# Patient Record
Sex: Female | Born: 1960 | Race: Black or African American | Hispanic: No | Marital: Married | Smoking: Never smoker
Health system: Southern US, Community
[De-identification: ages and names within clinical notes are randomized; demographics above are authoritative.]

## PROBLEM LIST (undated history)

## (undated) DIAGNOSIS — D126 Benign neoplasm of colon, unspecified: Secondary | ICD-10-CM

## (undated) DIAGNOSIS — Z8719 Personal history of other diseases of the digestive system: Secondary | ICD-10-CM

## (undated) DIAGNOSIS — M179 Osteoarthritis of knee, unspecified: Secondary | ICD-10-CM

## (undated) DIAGNOSIS — K219 Gastro-esophageal reflux disease without esophagitis: Secondary | ICD-10-CM

## (undated) DIAGNOSIS — I499 Cardiac arrhythmia, unspecified: Secondary | ICD-10-CM

## (undated) DIAGNOSIS — K635 Polyp of colon: Secondary | ICD-10-CM

## (undated) DIAGNOSIS — M1710 Unilateral primary osteoarthritis, unspecified knee: Secondary | ICD-10-CM

## (undated) DIAGNOSIS — K222 Esophageal obstruction: Secondary | ICD-10-CM

## (undated) DIAGNOSIS — S82892A Other fracture of left lower leg, initial encounter for closed fracture: Secondary | ICD-10-CM

## (undated) DIAGNOSIS — G43909 Migraine, unspecified, not intractable, without status migrainosus: Secondary | ICD-10-CM

## (undated) DIAGNOSIS — D649 Anemia, unspecified: Secondary | ICD-10-CM

## (undated) DIAGNOSIS — O00109 Unspecified tubal pregnancy without intrauterine pregnancy: Secondary | ICD-10-CM

## (undated) HISTORY — DX: Migraine, unspecified, not intractable, without status migrainosus: G43.909

## (undated) HISTORY — PX: WISDOM TOOTH EXTRACTION: SHX21

## (undated) HISTORY — DX: Anemia, unspecified: D64.9

## (undated) HISTORY — DX: Polyp of colon: K63.5

## (undated) HISTORY — DX: Benign neoplasm of colon, unspecified: D12.6

## (undated) HISTORY — DX: Osteoarthritis of knee, unspecified: M17.9

## (undated) HISTORY — DX: Unspecified tubal pregnancy without intrauterine pregnancy: O00.109

## (undated) HISTORY — PX: COLONOSCOPY: SHX174

## (undated) HISTORY — DX: Gastro-esophageal reflux disease without esophagitis: K21.9

## (undated) HISTORY — DX: Unilateral primary osteoarthritis, unspecified knee: M17.10

## (undated) HISTORY — DX: Esophageal obstruction: K22.2

---

## 1998-02-23 ENCOUNTER — Other Ambulatory Visit: Admission: RE | Admit: 1998-02-23 | Discharge: 1998-02-23 | Payer: Self-pay | Admitting: Obstetrics and Gynecology

## 1999-08-26 ENCOUNTER — Other Ambulatory Visit: Admission: RE | Admit: 1999-08-26 | Discharge: 1999-08-26 | Payer: Self-pay | Admitting: Obstetrics and Gynecology

## 2000-11-27 ENCOUNTER — Other Ambulatory Visit: Admission: RE | Admit: 2000-11-27 | Discharge: 2000-11-27 | Payer: Self-pay | Admitting: Obstetrics and Gynecology

## 2001-03-03 ENCOUNTER — Other Ambulatory Visit: Admission: RE | Admit: 2001-03-03 | Discharge: 2001-03-03 | Payer: Self-pay | Admitting: Obstetrics and Gynecology

## 2002-02-25 ENCOUNTER — Other Ambulatory Visit: Admission: RE | Admit: 2002-02-25 | Discharge: 2002-02-25 | Payer: Self-pay | Admitting: Obstetrics and Gynecology

## 2003-01-14 DIAGNOSIS — O00109 Unspecified tubal pregnancy without intrauterine pregnancy: Secondary | ICD-10-CM

## 2003-01-14 HISTORY — DX: Unspecified tubal pregnancy without intrauterine pregnancy: O00.109

## 2003-01-14 HISTORY — PX: TUBAL LIGATION: SHX77

## 2003-02-16 ENCOUNTER — Ambulatory Visit (HOSPITAL_COMMUNITY): Admission: RE | Admit: 2003-02-16 | Discharge: 2003-02-16 | Payer: Self-pay | Admitting: Obstetrics and Gynecology

## 2003-02-16 ENCOUNTER — Encounter (INDEPENDENT_AMBULATORY_CARE_PROVIDER_SITE_OTHER): Payer: Self-pay | Admitting: *Deleted

## 2003-02-20 ENCOUNTER — Inpatient Hospital Stay (HOSPITAL_COMMUNITY): Admission: RE | Admit: 2003-02-20 | Discharge: 2003-02-20 | Payer: Self-pay | Admitting: Obstetrics and Gynecology

## 2003-05-04 ENCOUNTER — Other Ambulatory Visit: Admission: RE | Admit: 2003-05-04 | Discharge: 2003-05-04 | Payer: Self-pay | Admitting: Obstetrics and Gynecology

## 2004-01-14 HISTORY — PX: ABDOMINAL HYSTERECTOMY: SHX81

## 2004-04-15 ENCOUNTER — Ambulatory Visit: Payer: Self-pay | Admitting: Oncology

## 2004-04-16 ENCOUNTER — Ambulatory Visit: Payer: Self-pay | Admitting: Family Medicine

## 2004-04-17 ENCOUNTER — Inpatient Hospital Stay (HOSPITAL_COMMUNITY): Admission: AD | Admit: 2004-04-17 | Discharge: 2004-04-19 | Payer: Self-pay | Admitting: Internal Medicine

## 2004-04-18 ENCOUNTER — Ambulatory Visit: Payer: Self-pay | Admitting: Gastroenterology

## 2004-04-19 ENCOUNTER — Ambulatory Visit: Payer: Self-pay | Admitting: Internal Medicine

## 2004-04-22 ENCOUNTER — Ambulatory Visit: Payer: Self-pay | Admitting: Gastroenterology

## 2004-04-23 ENCOUNTER — Encounter: Admission: RE | Admit: 2004-04-23 | Discharge: 2004-04-23 | Payer: Self-pay | Admitting: Family Medicine

## 2004-04-23 ENCOUNTER — Ambulatory Visit: Payer: Self-pay | Admitting: Family Medicine

## 2004-04-24 ENCOUNTER — Ambulatory Visit: Payer: Self-pay | Admitting: Gastroenterology

## 2004-04-24 ENCOUNTER — Ambulatory Visit: Payer: Self-pay | Admitting: Oncology

## 2004-05-17 ENCOUNTER — Other Ambulatory Visit: Admission: RE | Admit: 2004-05-17 | Discharge: 2004-05-17 | Payer: Self-pay | Admitting: Obstetrics and Gynecology

## 2004-05-24 ENCOUNTER — Ambulatory Visit: Payer: Self-pay | Admitting: Gastroenterology

## 2004-05-28 ENCOUNTER — Encounter (INDEPENDENT_AMBULATORY_CARE_PROVIDER_SITE_OTHER): Payer: Self-pay | Admitting: *Deleted

## 2004-05-28 ENCOUNTER — Observation Stay (HOSPITAL_COMMUNITY): Admission: RE | Admit: 2004-05-28 | Discharge: 2004-05-29 | Payer: Self-pay | Admitting: Obstetrics and Gynecology

## 2004-06-28 ENCOUNTER — Ambulatory Visit: Payer: Self-pay | Admitting: Oncology

## 2004-11-18 ENCOUNTER — Ambulatory Visit: Payer: Self-pay | Admitting: Oncology

## 2009-07-02 ENCOUNTER — Telehealth: Payer: Self-pay | Admitting: Gastroenterology

## 2009-07-02 ENCOUNTER — Encounter: Payer: Self-pay | Admitting: Gastroenterology

## 2009-08-13 ENCOUNTER — Encounter (INDEPENDENT_AMBULATORY_CARE_PROVIDER_SITE_OTHER): Payer: Self-pay | Admitting: *Deleted

## 2009-08-13 HISTORY — PX: ESOPHAGOGASTRODUODENOSCOPY ENDOSCOPY: SHX5814

## 2009-08-13 HISTORY — PX: COLONOSCOPY: SHX174

## 2009-08-15 ENCOUNTER — Ambulatory Visit: Payer: Self-pay | Admitting: Gastroenterology

## 2009-08-23 ENCOUNTER — Ambulatory Visit: Payer: Self-pay | Admitting: Gastroenterology

## 2009-08-24 ENCOUNTER — Telehealth: Payer: Self-pay | Admitting: Gastroenterology

## 2009-08-24 ENCOUNTER — Encounter: Payer: Self-pay | Admitting: Gastroenterology

## 2009-09-18 ENCOUNTER — Encounter (INDEPENDENT_AMBULATORY_CARE_PROVIDER_SITE_OTHER): Payer: Self-pay | Admitting: *Deleted

## 2010-02-12 NOTE — Progress Notes (Signed)
Summary: rec COL date?  Phone Note From Other Clinic Call back at [facility] Phone [phone]   Caller: Clydie Braun, scheduler Call For: Dr. Russella Dar Reason for Call: Schedule Patient Appt Summary of Call: please call pt directly to discuss possibly moving up her recall COL date... pt concerned about recent hx of COL cancer in family  Initial call taken by: Vallarie Mare,  July 02, 2009 9:23 AM  Follow-up for Phone Call        The above number is a disconnected #.  I have left a message for Clydie Braun at [facility] to call back with a correct phone # Follow-up by: Darcey Nora RN, CGRN,  July 02, 2009 9:52 AM  Additional Follow-up for Phone Call Additional follow up Details #1::        Patient had [facility] with [facility].  She was questioned about a colon.  She wanted Dr Russella Dar to be aware she has a strong hx of CA in her family Father died with colon CA - dx in his 62's Brother recently died of lung/brain CA Sister- CA survivor- she is not sure of the exact kind, but was in the neck. Her recall is in for 04/2012.  She has no symptoms of change in bowel habits or rectal bleeding.  Please advise if this will alter her colon recall date.  New Problems: NEOPLASM, MALIGNANT, COLON, FAMILY HX, FATHER (ICD-V16.0)   Additional Follow-up for Phone Call Additional follow up Details #2::    Given her family history of colon cancer a 5 year interval is recommended and her last colonoscopy was in 04/2004. She is due now. Please schedule direct colonoscopy.  Follow-up by: Meryl Dare MD Clementeen Graham,  July 02, 2009 11:23 AM  Additional Follow-up for Phone Call Additional follow up Details #3:: Details for Additional Follow-up Action Taken: Patient  scheduled for colon [facility] 08/23/09 11:30, pre-visit 08/15/09 10:00 Additional Follow-up by: Darcey Nora RN, CGRN,  July 02, 2009 2:21 PM  New Problems: NEOPLASM, MALIGNANT, COLON, FAMILY HX, FATHER (ICD-V16.0)

## 2010-02-12 NOTE — Procedures (Signed)
Summary: Colonoscopy  Patient: [name] Note: All result statuses are Final unless otherwise noted.  Tests: (1) Colonoscopy (COL)   COL Colonoscopy           DONE      [facility]     [address]     [place], [place]  [address]           COLONOSCOPY PROCEDURE REPORT           PATIENT:  [name], [name]  MR#:  [id number]     BIRTHDATE:  [birth year], 49 yrs. old  GENDER:  female     ENDOSCOPIST:  Judie Petit T. Russella Dar, MD, [facility]           PROCEDURE DATE:  08/23/2009     PROCEDURE:  Colonoscopy with snare polypectomy     ASA CLASS:  Class I     INDICATIONS:  1) Elevated Risk Screening  2) family history of     colon cancer -father at 28.     MEDICATIONS:   Fentanyl 75 mcg IV, Versed 8 mg IV, Benadryl 25 mg     IV     DESCRIPTION OF PROCEDURE:   After the risks benefits and     alternatives of the procedure were thoroughly explained, informed     consent was obtained.  Digital rectal exam was performed and     revealed no abnormalities.   The LB [id number]-[id number] endoscope     was introduced through the anus and advanced to the cecum, which     was identified by both the appendix and ileocecal valve, limited     by a tortuous and redundant colon.    The quality of the prep was     good, using MoviPrep.  The instrument was then slowly withdrawn as     the colon was fully examined.     <<PROCEDUREIMAGES>>     FINDINGS:  A sessile polyp was found in the sigmoid colon. It was     5 mm in size. Polyp was snared without cautery. Retrieval was     successful. A normal appearing cecum, ileocecal valve, and     appendiceal orifice were identified. The ascending, hepatic     flexure, transverse, splenic flexure, descending colon, and rectum     appeared unremarkable.Retroflexed views in the rectum revealed no     abnormalities.  The time to cecum =  5.5  minutes. The scope was     then withdrawn (time =  11.25  min) from the patient and the     procedure completed.  COMPLICATIONS:  None           ENDOSCOPIC IMPRESSION:     1) 5 mm sessile polyp in the sigmoid colon           RECOMMENDATIONS:     1) Await pathology results     2) Repeat Colonoscopy in 5 years pending pathology review           Malcolm T. Russella Dar, MD, Clementeen Graham           CC: Guerry Bruin, MD           n.     Rosalie DoctorVenita Lick. Stark at 08/23/2009 11:58 AM           Myrtie Soman, [id number]  Note: An exclamation mark (!) indicates a result that was not dispersed into the flowsheet. Document Creation Date: 08/23/2009 11:58 AM _______________________________________________________________________  (1) Order result  status: Final Collection or observation date-time: 08/23/2009 11:54 Requested date-time:  Receipt date-time:  Reported date-time:  Referring Physician:   Ordering Physician: Claudette Head [phone]) Specimen Source:  Source: Launa Grill Order Number: [id number] Lab site:   Appended Document: Colonoscopy     Procedures Next Due Date:    Colonoscopy: 08/2014

## 2010-02-12 NOTE — Letter (Signed)
Summary: Office Visit Letter  [facility]  [address] [place], [place] [address]   Phone: [phone]  Fax: [phone]      September 18, 2009 MRN: [id number]   [facility] [address] [place], [place]  [address]   Dear Ms. Waheed,   According to our records, it is time for you to schedule a follow-up office visit with [place] in November.   At your convenience, please call [phone] (option #2)to schedule an office visit. If you have any questions, concerns, or feel that this letter is in error, we would appreciate your call.   Sincerely,  Judie Petit T. Russella Dar, M.D.  [facility] [phone]

## 2010-02-12 NOTE — Letter (Signed)
Summary: [facility] Instructions  [facility]  [address] [place], [place] [address]   Phone: [phone]  Fax: [phone]       [name]    05-13-1960    MRN: [id number]        Procedure Day /Date:  Thursday 08/23/2009     Arrival Time: 10:30 am      Procedure Time: 11:30 am     Location of Procedure:                    _x _  [facility] (4th Floor)                        PREPARATION FOR COLONOSCOPY WITH MOVIPREP   Starting 5 days prior to your procedure Saturday 8/6  do not eat nuts, seeds, popcorn, corn, beans, peas,  salads, or any raw vegetables.  Do not take any fiber supplements (e.g. Metamucil, Citrucel, and Benefiber).  THE DAY BEFORE YOUR PROCEDURE         DATE: Wednesday 8/10  1.  Drink clear liquids the entire day-NO SOLID FOOD  2.  Do not drink anything colored red or purple.  Avoid juices with pulp.  No orange juice.  3.  Drink at least 64 oz. (8 glasses) of fluid/clear liquids during the day to prevent dehydration and help the prep work efficiently.  CLEAR LIQUIDS INCLUDE: Water Jello Ice Popsicles Tea (sugar ok, no milk/cream) Powdered fruit flavored drinks Coffee (sugar ok, no milk/cream) Gatorade Juice: apple, white grape, white cranberry  Lemonade Clear bullion, consomm, broth Carbonated beverages (any kind) Strained chicken noodle soup Hard Candy                             4.  In the morning, mix first dose of MoviPrep solution:    Empty 1 Pouch A and 1 Pouch B into the disposable container    Add lukewarm drinking water to the top line of the container. Mix to dissolve    Refrigerate (mixed solution should be used within 24 hrs)  5.  Begin drinking the prep at 5:00 p.m. The MoviPrep container is divided by 4 marks.   Every 15 minutes drink the solution down to the next mark (approximately 8 oz) until the full liter is complete.   6.  Follow completed prep with 16 oz of clear liquid of your choice  (Nothing red or purple).  Continue to drink clear liquids until bedtime.  7.  Before going to bed, mix second dose of MoviPrep solution:    Empty 1 Pouch A and 1 Pouch B into the disposable container    Add lukewarm drinking water to the top line of the container. Mix to dissolve    Refrigerate  THE DAY OF YOUR PROCEDURE      DATE: Thursday 8/11  Beginning at 6:30 am (5 hours before procedure):         1. Every 15 minutes, drink the solution down to the next mark (approx 8 oz) until the full liter is complete.  2. Follow completed prep with 16 oz. of clear liquid of your choice.    3. You may drink clear liquids until 9:30 am (2 HOURS BEFORE PROCEDURE).   MEDICATION INSTRUCTIONS  Unless otherwise instructed, you should take regular prescription medications with a small sip of water   as early as possible the morning  of your procedure.           OTHER INSTRUCTIONS  You will need a responsible adult at least 49 years of age to accompany you and drive you home.   This person must remain in the waiting room during your procedure.  Wear loose fitting clothing that is easily removed.  Leave jewelry and other valuables at home.  However, you may wish to bring a book to read or  an iPod/MP3 player to listen to music as you wait for your procedure to start.  Remove all body piercing jewelry and leave at home.  Total time from sign-in until discharge is approximately 2-3 hours.  You should go home directly after your procedure and rest.  You can resume normal activities the  day after your procedure.  The day of your procedure you should not:   Drive   Make legal decisions   Operate machinery   Drink alcohol   Return to work  You will receive specific instructions about eating, activities and medications before you leave.    The above instructions have been reviewed and explained to me by   Ezra Sites RN  August 15, 2009 10:29 AM     I fully understand and  can verbalize these instructions _____________________________ Date _________

## 2010-02-12 NOTE — Progress Notes (Signed)
Summary: phone call  ---- Converted from flag ---- ---- 08/24/2009 7:59 AM, Karl Bales RN wrote: Phone Note  Outgoing Call   Call placed by: Karl Bales RN,  August 24, 2009 7:53 AM Call placed to: Patient Summary of Call: Writer called pt this a.m. to check her condition after her procedure yesterday.  Pt states that she had just called the on call MD because she was having "lower back pain" rating as a "4 1/2 to 5."  States she has not had this pain before.  Also, c/o abdominal cramping, rating as a "6".  Pt states she is worried she has not had a BM yet and writing  stated it may take a day or so to have a normal BM.  Pt states she is passing air, has no fever, and has not noted any blood.  Pt was able to eat yesterday and states she ate the foods suggested by recovery room nurse.  Pt states that when she spoke to on call MD, she told them she would prefer to speak with Dr. Russella Dar about her concerns. Initial call taken by: Karl Bales RN,  August 24, 2009 7:56 AM ------------------------------  Phone Note Call from Patient   Summary of Call: If abd pain or back pain does not resolve over next 24-48 hours she should contact us. Per Dr. Russella Dar  Follow-up for Phone Call        Spoke with pt again, states she is still experiencing some cramping and lower back pain.  Pt advised of Dr. Ardell Isaacs order- to call back in 24 to 48 hrs if pain doesn't get better.  Pt told to drink warm fluids t/o day and ambulate around house as much as possible to allow any trapped air to pass.  Suggested pt to follow diet given in RR- avoiding gas forming foods and anything greasy or fatty.  Pt voiced understanding and will notify office with any further needs Follow-up by: Karl Bales RN,  August 24, 2009 9:27 AM

## 2010-02-12 NOTE — Letter (Signed)
Summary: Patient Notice-Hyperplastic Polyps  [facility]  [address] [place], [place] [address]   Phone: [phone]  Fax: [phone]        August 24, 2009 MRN: [id number]    [facility] [address] [place], [place]  [address]    Dear [name],  I am pleased to inform you that the colon polyp(s) removed during your recent colonoscopy was (were) found to be hyperplastic. These types of polyps are NOT pre-cancerous.  It is my recommendation that you have a repeat colonoscopy examination in 5 years.  Should you develop new or worsening symptoms of abdominal pain, bowel habit changes or bleeding from the rectum or bowels, please schedule an evaluation with either your primary care physician or with me.  Continue treatment plan as outlined the day of your exam.  Please call us if you are having persistent problems or have questions about your condition that have not been fully answered at this time.  Sincerely,  Meryl Dare MD [facility]  This letter has been electronically signed by your physician.  Appended Document: Patient Notice-Hyperplastic Polyps letter mailed

## 2010-02-12 NOTE — Progress Notes (Signed)
Summary: phone  Phone Note Outgoing Call   Call placed by: Karl Bales RN,  August 24, 2009 7:53 AM Call placed to: Patient Summary of Call: Writer called pt this a.m. to check her condition after her procedure yesterday.  Pt states that she had just called the on call MD because she was having "lower back pain" rating as a "4 1/2 to 5."  States she has not had this pain before.  Also, c/o abdominal cramping, rating as a "6".  Pt states she is worried she has not had a BM yet and writing  stated it may take a day or so to have a normal BM.  Pt states she is passing air, has no fever, and has not noted any blood.  Pt was able to eat yesterday and states she ate the foods suggested by recovery room nurse.  Pt states that when she spoke to on call MD, she told them she would prefer to speak with Dr. Russella Dar about her concerns. Initial call taken by: Karl Bales RN,  August 24, 2009 7:56 AM

## 2010-02-12 NOTE — Miscellaneous (Signed)
Summary: [facility] PV  Clinical Lists Changes  Medications: Added new medication of MOVIPREP 100 GM  SOLR (PEG-KCL-NACL-NASULF-NA ASC-C) As per prep instructions. - Signed Rx of MOVIPREP 100 GM  SOLR (PEG-KCL-NACL-NASULF-NA ASC-C) As per prep instructions.;  #1 x 0;  Signed;  Entered by: Ezra Sites RN;  Authorized by: Meryl Dare MD [facility];  Method used: Electronically to [employer] [facility]. [place]. [phone]*, [address] [place], [place], [place]  [address], Ph: [phone] or [phone], Fax: [phone] Allergies: Added new allergy or adverse reaction of PENICILLIN Observations: Added new observation of NKA: F (08/15/2009 10:00)    Prescriptions: MOVIPREP 100 GM  SOLR (PEG-KCL-NACL-NASULF-NA ASC-C) As per prep instructions.  #1 x 0   Entered by:   Ezra Sites RN   Authorized by:   Meryl Dare MD [facility]   Signed by:   Ezra Sites RN on 08/15/2009   Method used:   Electronically to        [facility]. [address]. [phone]* (retail)       [address]       [place], [place]  [address]       Ph: [phone] or [phone]       Fax: [phone]   RxID:   [id number]

## 2010-02-12 NOTE — Procedures (Signed)
Summary: Colonoscopy   Colonoscopy  Procedure date:  04/24/2004  Findings:      Location:  [facility].   Patient Name: [name], [name] MRN:  Procedure Procedures: Colonoscopy CPT: [phone].  Personnel: Endoscopist: Venita Lick. Russella Dar, MD, Clementeen Graham.  Referred By: Gershon Crane, MD.  Exam Location: Exam performed in Outpatient Clinic. Outpatient  Patient Consent: Procedure, Alternatives, Risks and Benefits discussed, consent obtained, from patient. Consent was obtained by the RN.  Indications  Evaluation of: Anemia with low iron saturation.  History  Current Medications: Patient is not currently taking Coumadin.  Pre-Exam Physical: Performed Apr 24, 2004. Cardio-pulmonary exam, Rectal exam, HEENT exam , Abdominal exam, Neurological exam, Mental status exam WNL.  Exam Exam: Extent of exam reached: Cecum, extent intended: Cecum.  The cecum was identified by appendiceal orifice and IC valve. Colon retroflexion performed. ASA Classification: II. Tolerance: excellent.  Monitoring: Pulse and BP monitoring, Oximetry used. Supplemental O2 given.  Colon Prep Used Miralax for colon prep. Prep results: excellent.  Sedation Meds: Patient assessed and found to be appropriate for moderate (conscious) sedation. Fentanyl 100 mcg. given IV. Versed 10 mg. given IV.  Comments: Tortuous colon-moderately difficult exam Findings NORMAL EXAM: Cecum to Rectum.   Assessment Normal examination.  Events  Unplanned Interventions: No intervention was required.  Unplanned Events: There were no complications. Plans Disposition: After procedure patient sent to recovery. After recovery patient sent home.  Scheduling/Referral: Colonoscopy, to [facility] T. Russella Dar, MD, [facility], around Apr 24, 2012.  Primary Care Provider, to Gershon Crane, MD, around May 08, 2004.  EGD, to [employer]. Russella Dar, MD, Clementeen Graham, on Apr 24, 2004.    cc: Dr Gershon Crane  This report was created from the original  endoscopy report, which was reviewed and signed by the above listed endoscopist.

## 2010-02-12 NOTE — Procedures (Signed)
Summary: EGD   EGD  Procedure date:  04/24/2004  Findings:      Location: [facility]    EGD  Procedure date:  04/24/2004  Findings:      Location: [facility]   Patient Name: [name], [name] MRN:  Procedure Procedures: Panendoscopy (EGD) CPT: 43235.    with biopsy(s)/brushing(s). CPT: D1846139.  Personnel: Endoscopist: Venita Lick. Russella Dar, MD, Clementeen Graham.  Referred By: Gershon Crane, MD.  Exam Location: Exam performed in Outpatient Clinic. Outpatient  Patient Consent: Procedure, Alternatives, Risks and Benefits discussed, consent obtained, from patient. Consent was obtained by the RN.  Indications  Evaluation of: Anemia,  with low iron saturation.  Symptoms: Reflux symptoms  History  Current Medications: Patient is not currently taking Coumadin.  Pre-Exam Physical: Performed Apr 24, 2004  Cardio-pulmonary exam, HEENT exam, Abdominal exam, Neurological exam, Mental status exam WNL.  Exam Exam Info: Maximum depth of insertion Duodenum, intended Duodenum. Vocal cords not visualized. Gastric retroflexion performed. ASA Classification: II. Tolerance: excellent.  Sedation Meds: Patient assessed and found to be appropriate for moderate (conscious) sedation. Residual sedation present from prior procedure today. Cetacaine Spray 2 sprays given aerosolized. Fentanyl 25 mcg. given IV. Versed 5 mg. given IV.  Monitoring: BP and pulse monitoring done. Oximetry used. Supplemental O2 given  Findings Normal: Proximal Esophagus to Mid Esophagus.  ESOPHAGEAL INFLAMMATION: Severity is moderate, erosions present.  [place] Classification: Grade B. ICD9: Esophagitis, Reflux: 530.11.  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  34 cm from mouth. Lumen diameter is 16 mm. Biopsy of Stricture/Steno  taken. ICD9: Esophageal Stricture: 530.3.  OTHER FINDING: HH-R/O Barrett's in Distal Esophagus. Biopsy/Other Finding taken. Comments: biopsies at 35cm. HH  from 34-38cm.  Normal: Fundus to Duodenal 2nd Portion.   Assessment  Diagnoses: 530.3: Esophageal Stricture.  530.11: Esophagitis, Reflux.   Events  Unplanned Intervention: No unplanned interventions were required.  Unplanned Events: There were no complications. Plans Medication(s): Await pathology. PPI: Pantoprazole/Protonix 40 mg BID,   Patient Education: Patient given standard instructions for: Reflux. Stenosis / Stricture.  Disposition: After procedure patient sent to recovery. After recovery patient sent home.  Scheduling: Office Visit, to [employer]. Russella Dar, MD, [facility], around May 24, 2004.  Primary Care Provider, to Gershon Crane, MD,   cc: Dr Gershon Crane   This report was created from the original endoscopy report, which was reviewed and signed by the above listed endoscopist.

## 2010-02-12 NOTE — Letter (Signed)
Summary: Previsit letter  [facility]  [address] [place], [place] [address]   Phone: [phone]  Fax: [phone]       07/02/2009 MRN: [id number]  [facility] [address] [place], [place]  [address]  Dear [name],  Welcome to the Gastroenterology Division at [facility].    You are scheduled to see a nurse for your pre-procedure visit on 08/15/09 at 10:00 a.m. on the 3rd floor at [facility], [address]  We ask that you try to arrive at our office 15 minutes prior to your appointment time to allow for check-in.  Your nurse visit will consist of discussing your medical and surgical history, your immediate family medical history, and your medications.    Please bring a complete list of all your medications or, if you prefer, bring the medication bottles and we will list them.  We will need to be aware of both prescribed and over the counter drugs.  We will need to know exact dosage information as well.  If you are on blood thinners (Coumadin, Plavix, Aggrenox, Ticlid, etc.) please call our office today/prior to your appointment, as we need to consult with your physician about holding your medication.   Please be prepared to read and sign documents such as consent forms, a financial agreement, and acknowledgement forms.  If necessary, and with your consent, a friend or relative is welcome to sit-in on the nurse visit with you.  Please bring your insurance card so that we may make a copy of it.  If your insurance requires a referral to see a specialist, please bring your referral form from your primary care physician.  No co-pay is required for this nurse visit.     If you cannot keep your appointment, please call [phone] to cancel or reschedule prior to your appointment date.  This allows [place] the opportunity to schedule an appointment for another patient in need of care.    Thank you for choosing [facility] for your  medical needs.  We appreciate the opportunity to care for you.  Please visit [place] at our website  to learn more about our practice.                     Sincerely.                                                                                                                   The Gastroenterology Division  Appended Document: Previsit letter letter mailed to patient's home

## 2010-05-31 NOTE — Discharge Summary (Signed)
[name], LINDAHL              ACCOUNT NO.:  [id number]   MEDICAL RECORD NO.:  [id number]          PATIENT TYPE:  INP   LOCATION:  5511                         FACILITY:  [facility]   PHYSICIAN:  Bruce Rexene Edison. Swords, M.D. Romualdo Bolk OF BIRTH:  Jun 13, 1960   DATE OF ADMISSION:  04/17/2004  DATE OF DISCHARGE:  04/19/2004                                 DISCHARGE SUMMARY   DISCHARGE DIAGNOSES:  1.  Anemia, likely secondary to menometrorrhagia.  2.  Migraine headaches.  3.  History of tubal ligation.   DISCHARGE MEDICATIONS:  None other than [place].   HOSPITAL PROCEDURES:  1.  IV iron.  2.  Portable chest x-ray demonstrates upper limit heart size, hazy opacity      of the medial right upper lobe (followup 2-view chest x-ray is      recommended).   HOSPITAL LABORATORIES:  CBC April 19, 2004:  Hemoglobin 7.9, admission  hemoglobin 5.8.  B12 level 279, iron level 196 (collected April 18, 2004.  TIBC 423, percent saturation 46, ferritin level was low at 8 (normal 10-  291), TSH normal at 0.789, fecal occult blood negative, LDH was normal at  106.   HOSPITAL COURSE:  The patient was admitted to the hospitalist service on  April 17, 2004 with history of shortness of breath and fatigue see Dr. Claris Che  note for details. The patient was seen in consultation by hematology.  The patient was transfused with IV iron and 2 units of packed red blood  cells.  The patient is scheduled for a GI evaluation next week.  She has a  GYN appointment on April 29, 2004.  The patient is stable at the time of  discharge and feels well.      BHS/MEDQ  D:  04/19/2004  T:  04/19/2004  Job:  [id number]   cc:   Guy Sandifer. Arleta Creek, M.D.  [address]  [place]  [place] [address]  Fax: [phone]   Tera Mater. Clent Ridges, M.D. [facility]   Samul Dada, M.D.  [address] [facility]  [place]  [place] [address]  Fax: [phone]

## 2010-05-31 NOTE — Consult Note (Signed)
[name], [name]              ACCOUNT NO.:  [id number]   MEDICAL RECORD NO.:  [id number]          PATIENT TYPE:  INP   LOCATION:  5511                         FACILITY:  [facility]   PHYSICIAN:  Marlowe Kays, P.A.     DATE OF BIRTH:  [birth year]   DATE OF CONSULTATION:  04/18/2004  DATE OF DISCHARGE:                                   CONSULTATION   CONSULTANT:  __________   REASON FOR CONSULTATION:  Severe anemia.   REFERRING MD:  Rene Paci, M.D. [facility].   HISTORY OF PRESENT ILLNESS:  [name] is a delightful 50 year old African-  American female with a history of iron deficiency anemia, on iron supplement  until six months ago, with a baseline hemoglobin of 11 dating back to at  least November 2003.  She was asked to be seen in consultation for  evaluation of severe anemia.  In review, the patient has a history of heavy  menstrual bleeding, which is followed by Dr. Henderson Cloud, having about eight-day  duration of periods, four to five of those days with heavy menses, having to  change pads every 1-1/2 to three hours.  Her last period was on April 08, 2004, lasting about one week.  However, in February, she had had two  periods.  She presented to Dr. Clent Ridges, her primary care physician, with a two-  day history of increasing fatigue, for which labs were drawn.  Labs on April 16, 2004 demonstrated a hemoglobin of 5.8.  Thus, Dr. Clent Ridges recommended that  the patient be admitted in the hospital for further workup.  She was  transfused 2 units of packed red blood cells for a hemoglobin of 5.7.  She  denies any GI bleeding.  No pica.  She is not a vegetarian.  She does drink  a significant amount of iced tea.  She has a history of GERD.  GI evaluated  the patient while in the hospital, recommending outpatient colonoscopy for  further workup.  This is scheduled for April 24, 2004.   PAST MEDICAL HISTORY:  1.  Menorrhagia, followed by Dr. Henderson Cloud.  2.  Migraine headaches.  3.  GERD.  4.   Stress urinary incontinence.  5.  DJD of the knees.   SURGERIES:  Status post tubal ligation.  Status post right salpingectomy  with D&C by Dr. Henderson Cloud in February 2005.   ALLERGIES:  PENICILLIN and AMOXICILLIN.   CURRENT MEDICATIONS:  Protonix 40 mg p.o. daily, Tylenol.   REVIEW OF SYSTEMS:  Remarkable for fatigue, no weight loss or anorexia.  She  has intermittent headaches related to her history of migraines.  No  shortness of breath at rest, but dyspnea on exertion is present.  No cough  or sputum production.  No chest pain or palpitations.  No nausea, vomiting  or diarrhea.  No constipation.  No dysphagia.  She denies any blood in the  stools, dysuria or gross hematuria.  No myalgia, numbness or tingling.  No  peripheral edema.  Of note, the patient states that hysterectomy had been  recommended in the past, when she was  ready.   FAMILY HISTORY:  Mother alive and well.  Father deceased, with a history of  liver cancer and kidney disease.  The rest of the family history is  noncontributory.   SOCIAL HISTORY:  The patient is married.  She has two children in college.  She works at [employer].  No alcohol or tobacco history.   PHYSICAL EXAMINATION:  GENERAL: This is a 50 year old African-American female in no acute distress,  alert and oriented x3.  VITAL SIGNS: Blood pressure 110/68, pulse 90, respirations 16, temperature  98.4.  Pulse oximetry 99% on room air.  Weight 172 pounds.  HEENT: Normocephalic/atraumatic.  PERRLA.  Oral mucosa without thrush.  No  lesions.  NECK: Supple.  No cervical or supraclavicular masses.  LUNGS: Clear to auscultation bilaterally.  No axillary masses.  BREASTS: Without masses.  CARDIOVASCULAR: Regular rate and rhythm, with a very soft, 1/6 systolic  murmur.  No rubs or gallops.  ABDOMEN: Soft, nontender.  Bowel sounds x4.  No palpable spleen or liver.  GENITOURINARY/RECTAL: Deferred.  EXTREMITIES: No clubbing or cyanosis.  No edema.   SKIN: Without lesions, bruising or petechiae.  NEUROLOGICAL: Nonfocal.   LABORATORY DATA:  Hemoglobin 5.7, hematocrit 18.6, white count 6.7,  platelets 524, MCV 53.6, neutrophils 4.2.  PT 15.6, INR 1.4, TSH 0.98, iron  8, ferritin 1.6, B12 220, folic acid 2.5.  Total bilirubin 0.5, alkaline  phosphatase 72, AST 17, ALT 12, total protein 6.5, albumin 3.4, calcium 9.0,  sodium 141, potassium 3.9, BUN 4, creatinine 0.6.   ASSESSMENT AND PLAN:  The patient has been seen and evaluated by Dr.  __________.  Her peripheral smear is virtually diagnostic of iron deficiency  anemia.  She has striking hypochromia.  Nothing to suggest vitamin 12  deficiency by smear despite vitamin 12 equal 220.  Would recommend the  patient receive IV iron tonight, InFeD - iron dextran, and she can be  discharged in the morning with followup.  Will be happy to see the patient  after discharge.  Her IV iron will not interfere with plans for endo and  colonoscopy.  In addition, will check her LDH, epoetin retic counts and  parietal cell and intrinsic factor antibodies.   Thank you much for allowing [place] the opportunity to participate in the care of  [name].      SW/MEDQ  D:  04/19/2004  T:  04/19/2004  Job:  [id number]   cc:   Rene Paci, M.D. [facility]  [address] [place], [place] [address]   Dr. Henderson Cloud

## 2010-05-31 NOTE — Op Note (Signed)
[name], [name]                        ACCOUNT NO.:  [id number]   MEDICAL RECORD NO.:  [id number]                   PATIENT TYPE:  AMB   LOCATION:  SDC                                  [facility]   PHYSICIAN:  Guy Sandifer. Arleta Creek, M.D.           DATE OF BIRTH:  1960-04-30   DATE OF PROCEDURE:  02/16/2003  DATE OF DISCHARGE:                                 OPERATIVE REPORT   PREOPERATIVE DIAGNOSIS:  Probable ectopic pregnancy.   POSTOPERATIVE DIAGNOSIS:  Probable ectopic pregnancy.   PROCEDURES:  1. Laparoscopy with right salpingectomy.  2. Dilation and curettage.   SURGEON:  Guy Sandifer. Henderson Cloud, M.D.   ANESTHESIA:  General with endotracheal intubation, Laqueta Due, M.D.   ESTIMATED BLOOD LOSS:  Less than 100 mL.   SPECIMENS:  1. Endometrial curettings.  2. Right fallopian tube.   INDICATIONS AND CONSENT:  This patient is a 50 year old, married, black  female, G2, P2, status post tubal ligation approximately 13 years ago, whose  last menstrual period was around December 20.  She has not had any bleeding,  cramping, fatigue, nausea, breast tenderness, or syncope.  She had a  positive urine pregnancy test at home which was verified in my office.  Ultrasound on February 2 revealed a large amount of fluid in the endometrial  canal with an extremely irregularly shaped endometrial canal.  There was  nothing suggesting a pole, nothing consistent with a gestational sac.  A 1.3  cm round hypoechoic mass, highly suspicious for ectopic pregnancy was noted  in the right adnexa.  There was no free fluid.  Possibility of ectopic  pregnancy was discussed.  The fact that the fluid contained in the  endometrial canal did not appear to be a gestational sac and certainly not a  normal gestational sac at the very least.  It is discussed with the patient  and her husband preoperatively.  Options of management have been reviewed,  and she presents for surgery.  D&C with laparoscopy,  probable right  salpingectomy has been discussed.  Potential risks and complications  including but not limited to infection; bowel, bladder, ureteral damage;  bleeding requiring transfusion of blood products with possible transfusion  reaction; HIV and hepatitis; DVT, PE, pneumonia, laparotomy, hysterectomy,  were all discussed.  All questions have been answered, and consent is signed  on the chart.   FINDINGS:  D&C produces a large amount of tissue, possibly consistent with  products of conception.  Abdominally, upper abdomen is normal.  There is no  free fluid.  She is status post tubal ligation, obviously on the left.  On  the right, the fallopian tube is largely intact except for a definite defect  in the very proximal portion of the tube.  There is a 1.5-2 cm mass in the  proximal one-third of the right fallopian tube.  It bleeds easily when  touched.  It appears possibly consistent with  an ectopic pregnancy.   DESCRIPTION OF PROCEDURE:  The patient was taken to the operating room where  she was identified.  She was then placed in a dorsal supine position where  general anesthesia was induced via endotracheal intubation.  She was then  placed in the dorsal lithotomy position where she was prepped abdominally  and vaginally.  Bladder was straight-catheterized, and she was draped in a  sterile fashion.  Cervix was then injected anteriorly and grasped with a  single-tooth tenaculum with 1% Xylocaine plain.  Paracervical block is then  placed at the 2, 4, 5, 7, 8, and 10 o'clock positions with approximately 20  mL total of 1% Xylocaine.  Cervix was then gently progressively dilated to a  25 Pratt dilator.  Some clear fluid was noted to egress.  Sharp curettage is  then carried out for a fair amount of tissue.  Therefore, suction curettage  with a #8 curved curette is carried out.  This is done with alternating  sharp and suction curettage until the cavity is clean.  Pitocin 20 units  are  added to the IV bag of fluids containing 1 liter.  At the completion of  this, good hemostasis is noted, and the single-tooth tenaculum is replaced  with a Hulka tenaculum.  Next, attention is turned to the abdomen.  A small  infraumbilical incision is made, and the Veress needle is placed without  difficulty.  Syringe and drop test are normal.  Two liters of gas are  insufflated under low pressure with good tympany in the right upper  quadrant.  Veress needle is then removed and is replaced with a 10-11  disposable trocar sleeve.  Proper placement is verified with a laparoscope,  and no damage to the surrounding structures is noted.  A small suprapubic  incision is made in the midline, and a 5 mm nondisposable trocar sleeve was  placed under direct visualization without difficulty.  The above described  findings are noted.  The mesosalpinx of the right fallopian tube is taken  down with a harmonic scalpel to the point it completely encircles the above-  described mass, thereby removing the entire section of fallopian tube.  Good  hemostasis is obtained also with bipolar cautery.  The section of fallopian  tube is removed via the trocar sleeve.  Hemostasis is verified under reduced  pneumoperitoneum.  Irrigation is carried out.  Suprapubic trocar sleeve is  removed.  Pneumoperitoneum is reduced, and the umbilical trocar sleeve is  removed.  The umbilical incision is closed with a 0 Vicryl suture in the  subcutaneous layers with great care being taken not to pick up any  underlying structures.  The incisions are injected with 0.5% plain Marcaine,  and the skin is closed with Dermabond on both incisions.  Hulka tenaculum is  removed.  No bleeding is noted.  The patient is awakened and taken to the  recovery room in stable condition.  All counts are correct.                                               Guy Sandifer Arleta Creek, M.D.    JET/MEDQ  D:  02/16/2003  T:  02/16/2003  Job:   [id number]

## 2010-05-31 NOTE — Op Note (Signed)
NAMEKARILYNN, CARRANZA              ACCOUNT NO.:  [id number]   MEDICAL RECORD NO.:  [id number]          PATIENT TYPE:  OBV   LOCATION:  9399                          [facility]  [facility]   PHYSICIAN:  Guy Sandifer. Henderson Cloud, M.D. DATE OF BIRTH:  04-Sep-1960   DATE OF PROCEDURE:  05/28/2004  DATE OF DISCHARGE:                                 OPERATIVE REPORT   PREOPERATIVE DIAGNOSES:  1.  Uterine leiomyomata.  2.  Right ovarian cyst.   POSTOPERATIVE DIAGNOSES:  1.  Uterine leiomyomata.  2.  Right ovarian cyst.   PROCEDURE:  Laparoscopically-assisted vaginal hysterectomy with right  oophorectomy.   SURGEON:  Guy Sandifer. Henderson Cloud, M.D.   ASSISTANTFreddy Finner, M.D.   ANESTHESIA:  General with endotracheal intubation.   ESTIMATED BLOOD LOSS:  Less than 100 mL.   SPECIMENS:  Uterus and right ovary.   INDICATIONS AND CONSENT:  This patient is a 50 year old married, black  female, G2, P2, status post tubal ligation with known uterine leiomyomata,  right ovarian cyst.  Details are dictated in History and Physical.  Laparoscopically assisted vaginal hysterectomy, possible right oophorectomy  has been discussed preoperatively.  The patient is status post right  salpingectomy.  The potential risks and complications have been reviewed and  include but are not limited to infection, bowel, bladder, ureteral damage,  bleeding requiring transfusion of blood products, and possible transfusion  reaction, HIV and hepatitis acquisition, DVT, PE, pneumonia, fistula  formation, postoperative dyspareunia.  All questions have been answered and  consent is signed on the chart.   FINDINGS:  Upper abdomen is grossly normal.  The uterus has at least one 4-5  cm intramural fibroid on the anterior uterine fundus.  The right tube has  been surgical removed.  The right ovary contains a 2-3 cm cyst on its medial  pole.  The left ovary is normal.  The left tube is status post ligation.   PROCEDURE IN DETAIL:  The  patient was taken to the operating room where she  was identified, placed in the dorsal supine position, and general anesthesia  was induced via endotracheal intubation.  She is then placed in the dorsal  lithotomy position where she was prepped abdominally and vaginally, bladder  straight catheterized.  Hulka tenaculum was placed in the uterus as a  manipulator, and she is draped in a sterile fashion.  A small infraumbilical  incision is made and a disposable Veress needle was placed.  Normal syringe  and drop test are noted, 2 L of gas were then insufflated under low pressure  with good tympany in the right upper quadrant.  A Veress needle is remove  and is replaced with a 10-11 bladeless, disposable, trocar.  Inspection  reveals no damage to surrounding structures.  Pneumoperitoneum is induced.  A small suprapubic incision is made and a 5 mm disposable bladeless trocar  sleeve is placed under direct visualization without difficulty.  The above  findings were noted.  The course of the right ureter is noted.  Then using  the Gyrus bipolar cautery cutting instrument, the right ovary is taken  down  and it is carried across the round ligament along the margin of the uterus  down to the level of the vesicouterine peritoneum.  The left proximal  ligaments were taken down in a similar fashion to the vesicouterine  peritoneum.  Good hemostasis is maintained bilaterally.  The vesicouterine  peritoneum is then incised in the midline, hydrodissected, and taken down  cephalolaterally with scissors.  Good hemostasis is noted.  Excess fluid is  removed.  The suprapubic trocar sleeve is removed and attention is turned to  the vagina.   The posterior cul-de-sac is entered sharply and the cervix is circumscribed.  Mucosa was then advanced sharply and bluntly.  Then using the Gyrus cautery,  the uterosacral ligaments were taken down bilaterally.  This was followed by  the bladder pillars and the cardinal  ligaments and uterine vessels  bilaterally.  Anterior cul-de-sac  was entered without difficulty.  After  the uterine vessels had been controlled, the fundus was delivered  posteriorly and the proximal ligaments were taken down without difficulty.  The uterosacral ligaments were then plicated to the vaginal cuff bilaterally  with separate sutures of 0 Monocryl.  All suture will be 0 Monocryl unless  otherwise designated.  A suture was placed in the 3 and 9 o'clock positions  to help assure hemostasis on vaginal cuff as well.  The uterosacral  ligaments were then plicated in the midline with a separate suture.  The  cuff was closed with figure-of-eight sutures.  A Foley catheter was placed  in the bladder and clear urine is noted.   Attention was then returned to the abdomen.  Careful inspection and  irrigation reveals minor oozing at some peritoneal edges which is easily  controlled with bipolar cautery.  Re-inspection and reducing the  pneumoperitoneum reveals good hemostasis all around.  Suprapubic trocar  sleeves is removed.  Pneumoperitoneum is completely reduced and the  umbilical trocar sleeve is removed.  A 0 Vicryl was used to reapproximate  the fascia in the umbilical incision with care being taken not to pick up  any underlying structures.  Both incisions were then injected with 0.5%  plain Marcaine and the skin is closed with Dermabond on both incisions.  All  counts were correct.  The patient was awakened, taken to recovery room in  stable condition.      JET/MEDQ  D:  05/28/2004  T:  05/28/2004  Job:  [id number]

## 2010-05-31 NOTE — Discharge Summary (Signed)
NAMEODILE, VELOSO              ACCOUNT NO.:  [id number]   MEDICAL RECORD NO.:  [id number]          PATIENT TYPE:  OBV   LOCATION:  9315                          [facility]   PHYSICIAN:  Guy Sandifer. Henderson Cloud, M.D. DATE OF BIRTH:  08-08-1960   DATE OF ADMISSION:  05/28/2004  DATE OF DISCHARGE:  05/29/2004                                 DISCHARGE SUMMARY   ADMITTING DIAGNOSES:  1.  Uterine leiomyomata.  2.  Right ovarian cyst.   DISCHARGE DIAGNOSES:  1.  Uterine leiomyomata.  2.  Right ovarian cyst.   PROCEDURE:  On May 27, 2004, laparoscopically assisted vaginal hysterectomy  and right oophorectomy.   REASON FOR ADMISSION:  The patient is a 50 year old married black female,  G2, P2, status post tubal ligation with known leiomyomata and increasing  symptoms.  Details in dictated History and Physical.  She is admitted for  surgical management.   HOSPITAL COURSE:  The patient is admitted to the hospital and undergoes the  above procedure.  Estimated blood loss is less than 100 cc.  On the evening  of surgery, she is having some nausea and headache with the medication.  There is a question as to whether it is related to the morphine or to the  Toradol.  The morphine is changed to Dilaudid and the Toradol was  discontinued.  Vital signs are stable and she is afebrile with clear urine  output.   On the day of discharge, she is tolerating a regular diet and feeling much  better with good pain relief with no nausea, vomiting, or headache.  She is  ambulating well.  Vital signs are stable and she is afebrile.  Abdomen is  soft and nontender.  Hemoglobin is 10.7, white count 9.0, and pathology is  pending.   CONDITION ON DISCHARGE:  Good.   DIET:  Regular as tolerated.   ACTIVITY:  No lifting, no operation of automobile, no vaginal entry.   DISCHARGE INSTRUCTIONS:  She is to call the office for problems including,  but not limited to, temperature of 101 degrees, persistent  nausea/vomiting,  increasing pain, or heavy vaginal bleeding.   FOLLOW UP:  In the office in 2 weeks.   MEDICATIONS:  1.  Percocet 5/325 mg #30 1-2 p.o. q.6h. p.r.n.  2.  Multivitamin daily.      JET/MEDQ  D:  05/29/2004  T:  05/29/2004  Job:  [id number]

## 2010-05-31 NOTE — H&P (Signed)
[name], [name]                        ACCOUNT NO.:  [id number]   MEDICAL RECORD NO.:  [id number]                   PATIENT TYPE:  AMB   LOCATION:  SDC                                  [facility]   PHYSICIAN:  Guy Sandifer. Arleta Creek, M.D.           DATE OF BIRTH:  Jun 02, 1960   DATE OF ADMISSION:  02/16/2003  DATE OF DISCHARGE:                                HISTORY & PHYSICAL   CHIEF COMPLAINT:  Probable ectopic pregnancy.   HISTORY OF PRESENT ILLNESS:  The patient is a 50 year old married black  female, G2, P2, status post tubal ligation in approximately 1995 with her  last menstrual period January 02, 2003.  She reports a positive urine  pregnancy test at home.  She also had a positive urine pregnancy test in my  office.  Denies pelvic pain, vaginal bleeding, syncope, nausea or vomiting.  Ultrasound in my office on February 15, 2003, reveals a large amount of fluid  in a very irregularly shaped endometrial canal not consistent with a  gestational sac.  The right ovary contains a 2.3 cm cyst.  There is a 1.3 cm  round hypoechoic mass highly suspicious for ectopic in the right adnexa  adjacent to the right ovary.  There is no free fluid noted.  The probability  of ectopic pregnancy is discussed with the patient.  Options and management  are discussed, and the patient wants surgical management.  She is being  admitted for laparoscopy, probable salpingectomy, and D&C.  Potential risks  and complications have been discussed with the patient preoperatively.   PAST MEDICAL HISTORY:  1. History of anemia.  2. History of menometrorrhagia.  3. History of headaches.  4. Gastroesophageal reflux disease.  5. Stress urinary incontinence.   PAST SURGICAL HISTORY:  Tubal ligation.   PAST OBSTETRICAL HISTORY:  Vaginal delivery x 2.   FAMILY HISTORY:  Diabetes in paternal grandmother, liver cancer in father,  prostate cancer in paternal grandfather, chronic hypertension maternal  grandmother, asthma in father, kidney disease in father.   SOCIAL HISTORY:  The patient denies tobacco, alcohol, or drug abuse.   MEDICATIONS:  Protonix daily, iron supplement daily.   ALLERGIES:  PENICILLIN and AMOXICILLIN.   REVIEW OF SYSTEMS:  NEUROLOGIC:  Denies recent headache.  CARDIAC:  Denies  chest pain. PULMONARY:  Denies shortness of breath.  GI:  Denies recent  changes in bowel habits.  Has reflux as above.   PHYSICAL EXAMINATION:  VITAL SIGNS:  Height 6 feet 0 inches, weight 190  pounds.  Blood pressure 108/70.  NECK:  Without thyromegaly.  LUNGS:  Clear to auscultation.  HEART:  Regular rate and rhythm.  BACK:  Without CVA tenderness.  BREASTS:  Without discharge.  ABDOMEN:  Soft, nontender.  PELVIC:  Exam deferred.  EXTREMITIES:  Grossly  within normal limits.  NEUROLOGIC:  Grossly  within normal limits.   IMPRESSION:  Probable ectopic pregnancy.   PLAN:  1. Laparoscopy.  2. Probable right salpingectomy.  3. Dilatation and curettage.                                               Guy Sandifer Arleta Creek, M.D.    JET/MEDQ  D:  02/15/2003  T:  02/15/2003  Job:  [id number]

## 2010-05-31 NOTE — H&P (Signed)
[name], [name]              ACCOUNT NO.:  [id number]   MEDICAL RECORD NO.:  [id number]          PATIENT TYPE:  INP   LOCATION:  5511                         FACILITY:  [facility]   PHYSICIAN:  Tera Mater. Clent Ridges, M.D. [facility] OF BIRTH:  17-Oct-1960   DATE OF ADMISSION:  04/17/2004  DATE OF DISCHARGE:                                HISTORY & PHYSICAL   CHIEF COMPLAINT:  This is a 50 year old woman with profound anemia, also  weakness and shortness of breath.   HISTORY OF PRESENT ILLNESS:  The patient has a history of mild anemia which  has been controlled with intermittent iron supplements.  The last recorded  hemoglobin I see on her chart was 11.0 on November 30, 2001.  She has been  on iron supplements for years, although she admits to stopping taking her  iron supplement about six months ago.  She does have a history of some GE  reflux disease, but no other known GI problems.  She does have a history of  heavy menstrual bleeding and, in fact, has been recommended by her  gynecologist (Dr. Henderson Cloud) to have a hysterectomy at some point.  She denies  being a vegetarian and says she has a fairly wide ranging diet.  For the  past several weeks she has had progressive fatigue and shortness of breath  on exertion.  She denies chest pain or abdominal pain.  She was seen  yesterday in the clinic with a fairly unremarkable examination, then today  her laboratories came back (see full detailed report) with a hemoglobin of  5.8.   PAST MEDICAL HISTORY:  1.  Migraine headaches.  2.  Status post a tubal ligation.   ALLERGIES:  None.   CURRENT MEDICATIONS:  1.  Maxalt as needed for headaches.  2.  The patient had been on iron sulfate 325 mg a day until she stopped six      months ago.   HABITS:  She does not use tobacco or alcohol.   SOCIAL HISTORY:  She is married.  She is a Merchandiser, retail with [employer].   FAMILY HISTORY:  Her father has asthma and liver cancer.  Her mother is  healthy.  One brother has emphysema.  One sister had a malignant tumor  removed from her neck and there is a strong family history of diabetes.   OBJECTIVE:  GENERAL:  She is in no apparent distress.  VITAL SIGNS:  Weight 172, temperature 97.3 degrees, pulse 68 and regular,  respirations 10 and comfortable, blood pressure 120/82.  SKIN:  Free of remarkable lesions.  HEENT:  Eyes are clear.  Ears are clear.  Oropharynx is clear.  NECK:  Supple without lymphadenopathy or masses.  LUNGS:  Clear.  CARDIAC:  Rate and rhythm are regular without gallops, murmurs, or rubs.  Distal pulses are full.  ABDOMEN:  Soft.  Normal bowel sounds.  Nontender.  No masses.  EXTREMITIES:  No clubbing, cyanosis, edema.  NEUROLOGIC:  Grossly intact.   LABORATORIES:  Report is remarkable for hemoglobin of 5.8 and MCV of 54.8, a  normal white blood cell count,  a normal platelet count.  Ferritin low at  1.6.  Folic acid low at 2.5.  B12 at the lower end of normal at 220.   IMPRESSION AND PLAN:  Severe multifactorial anemia probably compounded by  heavy menstrual losses.  Patient will be admitted for further work-up.  Will  consult gastroenterology to check for possible gastrointestinal losses.  Will consult hematology to look for blood dyscrasias.  Certainly  consideration for hysterectomy in the near future is warranted.  Once some  appropriate initial studies are done patient will need to be transfused  blood to get back to normal levels.  At the moment she is quite  hemodynamically stable and symptom-free at rest.  We will probably consult  nutrition at some point as well prior to discharge.       ___________________________________________  Tera Mater. Clent Ridges, M.D. [facility]    SAF/MEDQ  D:  04/17/2004  T:  04/17/2004  Job:  [id number]

## 2010-05-31 NOTE — H&P (Signed)
NAMEBRIANDA, [name]              ACCOUNT NO.:  [id number]   MEDICAL RECORD NO.:  [id number]          PATIENT TYPE:  AMB   LOCATION:  SDC                           FACILITY:  [facility]   PHYSICIAN:  Guy Sandifer. Henderson Cloud, M.D. DATE OF BIRTH:  1960/07/19   DATE OF ADMISSION:  05/28/2004  DATE OF DISCHARGE:                                HISTORY & PHYSICAL   CHIEF COMPLAINT:  Uterine fibroids.   HISTORY OF PRESENT ILLNESS:  This patient is a 50 year old, married, black  female, G2, P2, status post tubal ligation with a known uterine leiomyomata.  Over the past year she had increasingly heavy menses.  This led to admission  to [facility] earlier this year with anemia, reportedly with a  hemoglobin of 5.8.  She was given two units of packed red blood cells as  well as an iron transfusion.  Endoscopy and colonoscopy were consistent with  reflux esophagitis but no obvious source of bleeding.  Ultrasound in my  office, on April 29, 2004, revealed a uterus measuring 9.6 x 5.0 x 6.7-cm  with a 3.9-cm subserosal fibroid on the posterior uterine wall.  The right  ovary contained a 2.8-cm cyst with septations and internal echoes, possibly  representing an endometrioma versus a hemorrhagic cyst.  The patient had  been taking iron supplements.  Capillary hemoglobin is 11.2 on May 18, 2004.  After discussing alternatives for treatment, the patient is being admitted  for a laparoscopically assisted vaginal hysterectomy and a possible right  salpingo-oophorectomy.  Potential risks and complications of the procedure  have been discussed with the patient preoperatively.   PAST MEDICAL HISTORY:  1.  Anemia as above.  2.  Reflux disease and esophagitis as above.  3.  History of menometrorrhagia.  4.  History of headaches.   PAST SURGICAL HISTORY:  1.  Tubal ligation.  2.  Laparoscopy with right salpingectomy for ectopic pregnancy, 2005.   PAST OBSTETRIC HISTORY:  Vaginal delivery x 2.   FAMILY  HISTORY:  Diabetes in paternal grandmother.  Liver cancer in father.  Prostate cancer in paternal grandfather.  Chronic hypertension in maternal  grandmother.  Asthma in father.  Kidney disease in father.   SOCIAL HISTORY:  Denies tobacco, alcohol, or drug abuse.   MEDICATIONS:  Protonix daily, multivitamin daily.   ALLERGIES:  1.  PENICILLIN.  2.  AMOXICILLIN.   REVIEW OF SYSTEMS:  NEURO:  Denies headache.  PULMONARY:  Denies shortness  of breath.  CARDIAC:  Denies chest pain.  GI:  Denies recent changes in  bowel habits.   PHYSICAL EXAMINATION:  VITAL SIGNS:  Height 5 feet 11 inches, weight 172  pounds, blood pressure 98/60.  HEENT:  Without thyromegaly.  LUNGS:  Clear to auscultation.  HEART:  Regular rate and rhythm.  BACK:  Without CVA tenderness.  BREASTS:  Without mass, _retraction or_________  discharge.  ABDOMEN:  Soft nontender without masses.  PELVIC:  Uterus is upper limits of normal size, mobile, nontender.  Adnexa  nontender without palpable masses.  EXTREMITIES:  Grossly within normal limits.  NEUROLOGIC:  Grossly within normal  limits.   ASSESSMENT:  Uterine leiomyomata.   PLAN:  Laparoscopically assisted vaginal hysterectomy, possible right  salpingo-oophorectomy.      JET/MEDQ  D:  05/17/2004  T:  05/17/2004  Job:  [id number]

## 2010-05-31 NOTE — Discharge Summary (Signed)
[name], [name]              ACCOUNT NO.:  [id number]   MEDICAL RECORD NO.:  [id number]          PATIENT TYPE:  INP   LOCATION:  5511                         FACILITY:  [facility]   PHYSICIAN:  Marlowe Kays, P.A.     DATE OF BIRTH:  [birth year]   DATE OF ADMISSION:  04/17/2004  DATE OF DISCHARGE:                                 DISCHARGE SUMMARY   ADDENDUM TO DICTATION [phone]   The following labs are available prior to her discharge:  Vitamin B12 279.  Iron after transfusion is 196, TIBC 423, percentage saturation 46, ferritin  8, TAH 0.789.  Reticulocyte count is 12.7, LDH 106.  New CBC shows white  count 7.8, hemoglobin 7.9, hematocrit 24.7, and platelet count 587, with  neutrophil count of 6.9.  The following are pending:  Labs drawn on April 6,  that is, folic acid, erythropoietin, parietal cell A and intrinsic factor B.      SW/MEDQ  D:  04/19/2004  T:  04/19/2004  Job:  [id number]

## 2011-07-11 ENCOUNTER — Telehealth: Payer: Self-pay | Admitting: Gastroenterology

## 2011-07-11 NOTE — Telephone Encounter (Signed)
Last colonoscopy was 08/2009. She had a 5 mm hyperplastic polyp removed from the sigmoid colon.  She reports a strong family history of colon cancer in her father diagnosed in his late 54s, her maternal grandmother had gallbladder CA and a maternal grandfather with lung CA.  She has no current symptoms or complaints.  She would like to schedule a colonoscopy now and not wait until 08/2014.  Dr. Russella Dar please advise

## 2011-07-11 NOTE — Telephone Encounter (Signed)
Patient advised.

## 2011-07-11 NOTE — Telephone Encounter (Signed)
Family history of gallbladder cancer and lung cancer does not increase her risk of colon cancer.  Family history of colon cancer in her father and GM would be a 5 year screening interval. We can move to 3 years or 08/2012 but would not do colonoscopy sooner unless she is having symptoms.

## 2011-07-11 NOTE — Telephone Encounter (Signed)
Left message for patient to call back  

## 2012-03-31 ENCOUNTER — Encounter (HOSPITAL_COMMUNITY): Payer: Self-pay | Admitting: Emergency Medicine

## 2012-03-31 DIAGNOSIS — M545 Low back pain, unspecified: Secondary | ICD-10-CM | POA: Insufficient documentation

## 2012-03-31 DIAGNOSIS — R0789 Other chest pain: Secondary | ICD-10-CM | POA: Insufficient documentation

## 2012-03-31 DIAGNOSIS — R1013 Epigastric pain: Secondary | ICD-10-CM | POA: Insufficient documentation

## 2012-03-31 DIAGNOSIS — M2170 Unequal limb length (acquired), unspecified site: Secondary | ICD-10-CM | POA: Insufficient documentation

## 2012-03-31 DIAGNOSIS — K219 Gastro-esophageal reflux disease without esophagitis: Secondary | ICD-10-CM | POA: Insufficient documentation

## 2012-03-31 DIAGNOSIS — Z90710 Acquired absence of both cervix and uterus: Secondary | ICD-10-CM | POA: Insufficient documentation

## 2012-03-31 LAB — BASIC METABOLIC PANEL
BUN: 11 mg/dL (ref 6–23)
Calcium: 9.3 mg/dL (ref 8.4–10.5)
GFR calc non Af Amer: >90 mL/min (ref >90)
Glucose, Bld: 110 mg/dL — ABNORMAL HIGH (ref 70–99)
Potassium: 3.5 mEq/L (ref 3.5–5.1)

## 2012-03-31 LAB — CBC
HCT: 37.9 % (ref 36.0–46.0)
Hemoglobin: 13.3 g/dL (ref 12.0–15.0)
MCH: 28.7 pg (ref 26.0–34.0)
MCHC: 35.1 g/dL (ref 30.0–36.0)
MCV: 81.7 fL (ref 78.0–100.0)

## 2012-03-31 NOTE — ED Notes (Addendum)
C/o intermittent pressure to center of chest x 9 months.  States she thought she was having acid reflux- has not tried taking any antacids in the last 9 months.  Denies sob, nausea, and vomiting.  Reports she vomited a couple days ago because she was eating shrimp that got stuck in esophagus.  Pain resolved at this time.

## 2012-04-01 ENCOUNTER — Emergency Department (HOSPITAL_COMMUNITY)
Admission: EM | Admit: 2012-04-01 | Discharge: 2012-04-01 | Disposition: A | Discharge status: Home / Self Care | Payer: BC Managed Care – PPO | Attending: Emergency Medicine | Admitting: Emergency Medicine

## 2012-04-01 DIAGNOSIS — M545 Low back pain: Secondary | ICD-10-CM

## 2012-04-01 DIAGNOSIS — R079 Chest pain, unspecified: Secondary | ICD-10-CM

## 2012-04-01 DIAGNOSIS — K219 Gastro-esophageal reflux disease without esophagitis: Secondary | ICD-10-CM

## 2012-04-01 DIAGNOSIS — M2170 Unequal limb length (acquired), unspecified site: Secondary | ICD-10-CM

## 2012-04-01 MED ORDER — PANTOPRAZOLE SODIUM 40 MG PO TBEC
40.0000 mg | DELAYED_RELEASE_TABLET | Freq: Every day | ORAL | Status: DC
  Administered 2012-04-01: 40 mg via ORAL
  Filled 2012-04-01: qty 1

## 2012-04-01 MED ORDER — OMEPRAZOLE 20 MG PO CPDR: 40.0000 mg | DELAYED_RELEASE_CAPSULE | Freq: Every day | ORAL | Status: DC

## 2012-04-01 NOTE — ED Provider Notes (Signed)
History     CSN: [id number]  Arrival date & time 03/31/12  2043   First MD Initiated Contact with Patient 04/01/12 0022      Chief Complaint  Patient presents with  . Chest Pain    (Consider location/radiation/quality/duration/timing/severity/associated sxs/prior treatment) HPI [name] is a 52 y.o. female she presents to the ER with epigastric/chest pain-she was referred from urgent care was concerned with her EKG. Initially she had presented with left flank pain to the urgent care.  She has a history of GERD and  presents with epigastric pressure. She says this pain is been going on for months. She describes as a pressure like someone is sitting on her chest, she says it's been associated with food not going all the way down, (she had to regurgitate food the other day) not associated with shortness of breath, no radiation of pain, no diaphoresis or dizziness. The pain is intermittent, it is brief lasting seconds to minutes and is currently not present.  4-5 years ago, patient had endoscopy showing GERD, was on Protonix but stopped taking it because she thought she did not have GERD symptoms anymore. She is having occasional night sweats and hot flashes she thinks this is secondary to menopause.  Patient had a stress test "years ago" which was negative. Patient is status post hysterectomy and is not currently taking any estrogens. Denies any history of venous thromboembolic disease, denies any cancer diagnosis, has not had any recent immobilization, long bone fractures. No hemoptysis. Patient denies any recent sick contacts, she's not been sick recently, denies any headaches, changes in vision, fevers or chills. She says her paternal grandmother had congestive heart failure but there is no acute coronary syndrome/CAD and first degree relatives. No history of hyperlipidemia, hypertension, diabetes or smoking.   History reviewed. No pertinent past medical history.  Past Surgical History   Procedure Laterality Date  . Abdominal hysterectomy      No family history on file.  History  Substance Use Topics  . Smoking status: Never Smoker   . Smokeless tobacco: Not on file  . Alcohol Use: No    OB History   Grav Para Term Preterm Abortions TAB SAB Ect Mult Living                  Review of Systems At least 10pt or greater review of systems completed and are negative except where specified in the HPI.  Allergies  Codeine and Penicillins  Home Medications  No current outpatient prescriptions on file.  BP 124/81  Pulse 94  Temp(Src) 99.9 F (37.7 C) (Oral)  Resp 9  SpO2 100%  Physical Exam  Nursing notes reviewed.  Electronic medical record reviewed. VITAL SIGNS:   Filed Vitals:   04/01/12 0029 04/01/12 0030 04/01/12 0100 04/01/12 0130  BP: 124/81 129/115 127/76 141/85  Pulse: 94 91 86 93  Temp:      TempSrc:      Resp: 9 12    SpO2: 100% 100% 99% 99%   CONSTITUTIONAL: Awake, oriented, appears non-toxic HENT: Atraumatic, normocephalic, oral mucosa pink and moist, airway patent. Nares patent without drainage. External ears normal. EYES: Conjunctiva clear, EOMI, PERRLA NECK: Trachea midline, non-tender, supple CARDIOVASCULAR: Normal heart rate, Normal rhythm, No murmurs, rubs, gallops PULMONARY/CHEST: Clear to auscultation, no rhonchi, wheezes, or rales. Symmetrical breath sounds. Non-tender. ABDOMINAL: Non-distended, soft, non-tender - no rebound or guarding.  BS normal. Left flank tenderness to palpation, appears to be in the oblique muscle. NEUROLOGIC: Non-focal,  moving all four extremities, no gross sensory or motor deficits. EXTREMITIES: No clubbing, cyanosis, or edema. Right leg is 1 cm longer SKIN: Warm, Dry, No erythema, No rash  ED Course  Procedures (including critical care time)  Date: 04/01/2012  Rate: 80  Rhythm: normal sinus rhythm  QRS Axis: left  Intervals: normal  ST/T Wave abnormalities: normal  Conduction Disutrbances:  none  Narrative Interpretation: RSR', LAD, Sinus rhythm; No STEMI  Labs Reviewed  BASIC METABOLIC PANEL - Abnormal; Notable for the following:    Glucose, Bld 110 (*)    All other components within normal limits  CBC  POCT I-STAT TROPONIN I   No results found.   1. Chest pain   2. GERD (gastroesophageal reflux disease)   3. Lumbar pain   4. Leg length discrepancy       MDM  Verdis Bassette is a 52 y.o. female presenting with intermittent epigastric pain that is atypical - I. do not think the patient is having acute coronary syndrome at this time. Patient's EKG shows a left axis deviation, RSR prime, I do not see any ST or T wave abnormalities consistent with acute ischemia or infarction.  I do not think the left axis deviation is representative of an MI at this time.  This is been going on for some time and her troponin is negative. Patient's symptoms are more consistent with GERD, patient's has not been taking her tonics because she had been asymptomatic.  She's also having symptoms of achalasia versus esophageal stricture with regurgitation.  Patient is currently pain free at this time.  Pt can follow up with Dr. Wylene Simmer for stress testing - I don't think this is emergent or urgent.  She should follow up with her GI doctor for endoscopy.  Will restart ppi.  Leg length discrepancy noted - I think this is causing her left muscular flank pain for which she presented to Urgent Care with in the first place.  Referred her to podiatry for orthotic.  I explained the diagnosis and have given explicit precautions to return to the ER including any other new or worsening symptoms. The patient understands and accepts the medical plan as it's been dictated and I have answered their questions. Discharge instructions concerning home care and prescriptions have been given.  The patient is STABLE and is discharged to home in good condition.                 Jones Skene, MD 04/01/12 [phone]

## 2013-08-16 ENCOUNTER — Encounter (INDEPENDENT_AMBULATORY_CARE_PROVIDER_SITE_OTHER): Payer: Self-pay | Admitting: Surgery

## 2013-08-16 ENCOUNTER — Ambulatory Visit (INDEPENDENT_AMBULATORY_CARE_PROVIDER_SITE_OTHER): Payer: BC Managed Care – PPO | Admitting: Surgery

## 2013-08-16 VITALS — BP 104/70 | HR 71 | Temp 97.7°F | Resp 16 | Ht 71.0 in | Wt 203.0 lb

## 2013-08-16 DIAGNOSIS — R229 Localized swelling, mass and lump, unspecified: Secondary | ICD-10-CM

## 2013-08-16 DIAGNOSIS — R2231 Localized swelling, mass and lump, right upper limb: Secondary | ICD-10-CM | POA: Insufficient documentation

## 2013-08-16 NOTE — Patient Instructions (Signed)
Our schedulers will contact you in 24-48 hours to schedule surgery

## 2013-08-16 NOTE — Progress Notes (Signed)
Patient ID: [name], female   DOB: 04-13-1960, 53 y.o.   MRN: [id number]  Chief Complaint  Patient presents with  . Cyst    HPI [name] is a 53 y.o. female.   HPI This is a pleasant female referred to me by Dr. Osborne Walter for evaluation of a right shoulder mass. The patient first noticed the mass approximately one month ago. It was small it is now increased in size over the past month. It is causing discomfort both with her clothing or her bra comes across at as well as with motion. Again, she reports is not present one month ago. The pain is moderate in intensity and moves slightly down the arm. Past Medical History  Diagnosis Date  . GERD (gastroesophageal reflux disease)   . Anemia     Past Surgical History  Procedure Laterality Date  . Abdominal hysterectomy    . Tubal ligation  2005    History reviewed. No pertinent family history.  Social History History  Substance Use Topics  . Smoking status: Never Smoker   . Smokeless tobacco: Not on file  . Alcohol Use: No    Allergies  Allergen Reactions  . Codeine Other (See Comments)    headaches  . Penicillins     REACTION: headache    Current Outpatient Prescriptions  Medication Sig Dispense Refill  . benzonatate (TESSALON) 100 MG capsule       . betamethasone valerate (VALISONE) 0.1 % cream       . omeprazole (PRILOSEC) 20 MG capsule Take 2 capsules (40 mg total) by mouth daily.  30 capsule  0   No current facility-administered medications for this visit.    Review of Systems Review of Systems  Constitutional: Negative for fever, chills and unexpected weight change.  HENT: Negative for congestion, hearing loss, sore throat, trouble swallowing and voice change.   Eyes: Negative for visual disturbance.  Respiratory: Negative for cough and wheezing.   Cardiovascular: Negative for chest pain, palpitations and leg swelling.  Gastrointestinal: Negative for nausea, vomiting, abdominal pain, diarrhea,  constipation, blood in stool, abdominal distention and anal bleeding.  Genitourinary: Negative for hematuria, vaginal bleeding and difficulty urinating.  Musculoskeletal: Negative for arthralgias.  Skin: Negative for rash and wound.  Neurological: Negative for seizures, syncope and headaches.  Hematological: Negative for adenopathy. Does not bruise/bleed easily.  Psychiatric/Behavioral: Negative for confusion.    Blood pressure 104/70, pulse 71, temperature 97.7 F (36.5 C), temperature source Temporal, resp. rate 16, height 5\' 11"  (1.803 m), weight 203 lb (92.08 kg).  Physical Exam Physical Exam  Constitutional: She is oriented to person, place, and time. She appears well-developed and well-nourished. No distress.  HENT:  Head: Normocephalic and atraumatic.  Right Ear: External ear normal.  Left Ear: External ear normal.  Nose: Nose normal.  Mouth/Throat: No oropharyngeal exudate.  Eyes: Conjunctivae are normal. Pupils are equal, round, and reactive to light. Right eye exhibits no discharge. Left eye exhibits no discharge. No scleral icterus.  Neck: Normal range of motion. Neck supple. No tracheal deviation present.  Cardiovascular: Normal rate, regular rhythm, normal heart sounds and intact distal pulses.   No murmur heard. Pulmonary/Chest: Effort normal and breath sounds normal. No respiratory distress. She has no wheezes.  Musculoskeletal: Normal range of motion. She exhibits no edema and no tenderness.  There is a 4 cm mass anterior to the clavicle near its junction with the shoulder at the deltopectoral groove. The mass is soft and nonpulsatile. It is mildly  tender.  Lymphadenopathy:    She has no cervical adenopathy.    She has no axillary adenopathy.  Neurological: She is alert and oriented to person, place, and time.  Skin: Skin is warm and dry. No rash noted. She is not diaphoretic. No erythema.  Psychiatric: Her behavior is normal. Judgment normal.    Data  Reviewed   Assessment    4 cm left shoulder mass     Plan    Because of its quick increase in size and symptoms, removal of this mass is recommended for histologic evaluation to rule out malignancy. I have discussed this with her in detail. I discussed the risks of surgery which includes but is not limited to bleeding, infection, injury to surrounding structures, recurrence, need for further surgery should malignancy be present, et Ronney Asters. She understands and wishes to proceed with removal        Timothy Trudell A 08/16/2013, 3:47 PM

## 2013-09-01 ENCOUNTER — Ambulatory Visit (INDEPENDENT_AMBULATORY_CARE_PROVIDER_SITE_OTHER): Payer: BC Managed Care – PPO | Admitting: General Surgery

## 2013-10-28 ENCOUNTER — Encounter: Payer: Self-pay | Admitting: Gastroenterology

## 2014-01-13 DIAGNOSIS — D126 Benign neoplasm of colon, unspecified: Secondary | ICD-10-CM

## 2014-01-13 HISTORY — DX: Benign neoplasm of colon, unspecified: D12.6

## 2014-01-13 HISTORY — PX: UPPER GASTROINTESTINAL ENDOSCOPY: SHX188

## 2014-02-20 ENCOUNTER — Other Ambulatory Visit (INDEPENDENT_AMBULATORY_CARE_PROVIDER_SITE_OTHER): Payer: Self-pay | Admitting: Surgery

## 2014-04-24 ENCOUNTER — Other Ambulatory Visit: Payer: Self-pay | Admitting: Surgery

## 2014-07-07 ENCOUNTER — Encounter: Payer: Self-pay | Admitting: Gastroenterology

## 2014-08-18 ENCOUNTER — Encounter: Payer: Self-pay | Admitting: Gastroenterology

## 2014-08-23 ENCOUNTER — Other Ambulatory Visit: Payer: Self-pay | Admitting: Obstetrics and Gynecology

## 2014-08-25 LAB — CYTOLOGY - PAP

## 2014-08-31 ENCOUNTER — Encounter: Payer: Self-pay | Admitting: Gastroenterology

## 2014-08-31 ENCOUNTER — Ambulatory Visit (INDEPENDENT_AMBULATORY_CARE_PROVIDER_SITE_OTHER): Payer: BC Managed Care – PPO | Admitting: Gastroenterology

## 2014-08-31 VITALS — BP 120/78 | HR 78 | Ht 71.0 in | Wt 195.0 lb

## 2014-08-31 DIAGNOSIS — K2100 Gastro-esophageal reflux disease with esophagitis, without bleeding: Secondary | ICD-10-CM | POA: Insufficient documentation

## 2014-08-31 DIAGNOSIS — Z800 Family history of malignant neoplasm of digestive organs: Secondary | ICD-10-CM | POA: Insufficient documentation

## 2014-08-31 DIAGNOSIS — R1310 Dysphagia, unspecified: Secondary | ICD-10-CM | POA: Insufficient documentation

## 2014-08-31 NOTE — Progress Notes (Signed)
08/31/2014 [name] [id number] 05-01-1960   HISTORY OF PRESENT ILLNESS:  This is a 54 year old female who is known to Dr. Fuller Plan.  Last colonoscopy 08/2009 showed a 5 mm sessile polyp that was hyperplastic on pathology.  She has a family history of colon cancer in her father so repeat colonoscopy was recommended in 5 years from that time.  She has has history of reflux and dysphagia.  Had an EGD in 04/2004 at which time she was found to have an esophageal stricture that was dilated and reflux esophagitis.    She presents to our office today with complaints of intermittent dysphagia to solid food such as meats and bread.  Has been occuring about the past 6 months to 1 year.  Also has reflux and was recently taking Nexium 20 mg BID, which seemed to help those symptoms, but she says that she was told not to take it longer than a couple of weeks, so she has discontinued it again.  Was referred back here by her PCP, Dr. Osborne Casco.   Past Medical History  Diagnosis Date  . GERD (gastroesophageal reflux disease)   . Anemia   . Migraine   . OA (osteoarthritis) of knee     right  . Tubal pregnancy 2005  . Left anterior fascicular hemiblock   . Colon polyps    Past Surgical History  Procedure Laterality Date  . Abdominal hysterectomy  2006    partial, for fibroids & bleeding  . Tubal ligation  2005    reports that she has never smoked. She has never used smokeless tobacco. She reports that she does not drink alcohol or use illicit drugs. family history includes Cancer in her father, maternal grandmother, and sister; Diabetes in her paternal grandmother; Diabetes (age of onset: 52) in her father; Lung cancer in her brother and maternal grandfather; Other in her mother; Pancreatic cancer in her paternal grandfather. Allergies  Allergen Reactions  . Codeine Other (See Comments)    headaches  . Penicillins     REACTION: headache      Outpatient Encounter Prescriptions as of 08/31/2014    Medication Sig  . [DISCONTINUED] benzonatate (TESSALON) 100 MG capsule   . [DISCONTINUED] betamethasone valerate (VALISONE) 0.1 % cream   . [DISCONTINUED] omeprazole (PRILOSEC) 20 MG capsule Take 2 capsules (40 mg total) by mouth daily.   No facility-administered encounter medications on file as of 08/31/2014.     REVIEW OF SYSTEMS  : All other systems reviewed and negative except where noted in the History of Present Illness.   PHYSICAL EXAM: BP 120/78 mmHg  Pulse 78  Ht 5\' 11"  (1.803 m)  Wt 195 lb (88.451 kg)  BMI 27.21 kg/m2 General: Well developed black female in no acute distress Head: Normocephalic and atraumatic Eyes:  Sclerae anicteric, conjunctiva pink. Ears: Normal auditory acuity Lungs: Clear throughout to auscultation Heart: Regular rate and rhythm Abdomen: Soft, non-distended.  Normal bowel sounds.  Non-tender. Rectal:  Will be done at the time of colonoscopy. Musculoskeletal: Symmetrical with no gross deformities  Skin: No lesions on visible extremities Extremities: No edema  Neurological: Alert oriented x 4, grossly non-focal Psychological:  Alert and cooperative. Normal mood and affect  ASSESSMENT AND PLAN: -Dysphagia, history of esophageal stricture, GERD:  Now with solid food dysphagia.  Will schedule for EGD with dilation with Dr. Fuller Plan.  I have asked her to restart her Nexium that she was taking at home recently since it seemed to help  her reflux. -Family history of colon cancer in her father:  Will schedule for colonoscopy with Dr. Fuller Plan since last was 08/2009.  *The risks, benefits, and alternatives to EGD with dilation and colonoscopy were discussed with the patient and she consents to proceed.    CC:  Tisovec, Denice Paradise, MD

## 2014-08-31 NOTE — Patient Instructions (Signed)
You have been scheduled for an endoscopy. Please follow written instructions given to you at your visit today. If you use inhalers (even only as needed), please bring them with you on the day of your procedure. Your physician has requested that you go to www.startemmi.com and enter the access code given to you at your visit today. This web site gives a general overview about your procedure. However, you should still follow specific instructions given to you by our office regarding your preparation for the procedure.  Please call us back to set up a free pre-visit and colonoscopy appointment dates for November with Dr. Fuller Plan.     I appreciate the opportunity to care for you.

## 2014-09-01 NOTE — Progress Notes (Signed)
Reviewed and agree with management plan.  Alessia Gonsalez T. Katreena Schupp, MD FACG 

## 2014-09-07 ENCOUNTER — Encounter: Payer: Self-pay | Admitting: Gastroenterology

## 2014-10-25 ENCOUNTER — Encounter: Payer: BC Managed Care – PPO | Admitting: Gastroenterology

## 2014-11-10 ENCOUNTER — Ambulatory Visit (AMBULATORY_SURGERY_CENTER): Payer: Self-pay | Admitting: *Deleted

## 2014-11-10 VITALS — Ht 71.0 in | Wt 199.0 lb

## 2014-11-10 DIAGNOSIS — R1310 Dysphagia, unspecified: Secondary | ICD-10-CM

## 2014-11-10 DIAGNOSIS — Z800 Family history of malignant neoplasm of digestive organs: Secondary | ICD-10-CM

## 2014-11-10 MED ORDER — SUPREP BOWEL PREP KIT 17.5-3.13-1.6 GM/177ML PO SOLN: 1.0000 | Freq: Once | ORAL | Status: DC

## 2014-11-10 NOTE — Progress Notes (Signed)
Patient denies any allergies to egg or soy products. Patient denies complications with anesthesia/sedation.  Patient denies oxygen use at home and denies diet medications. Emmi instructions for colonoscopy/endoscopy explained and patient denied.

## 2014-11-24 ENCOUNTER — Encounter: Payer: BC Managed Care – PPO | Admitting: Gastroenterology

## 2015-01-05 ENCOUNTER — Ambulatory Visit (AMBULATORY_SURGERY_CENTER): Payer: BC Managed Care – PPO | Admitting: Gastroenterology

## 2015-01-05 ENCOUNTER — Encounter: Payer: Self-pay | Admitting: *Deleted

## 2015-01-05 ENCOUNTER — Encounter: Payer: Self-pay | Admitting: Gastroenterology

## 2015-01-05 VITALS — BP 104/77 | HR 61 | Temp 96.9°F | Resp 14 | Ht 71.0 in | Wt 199.0 lb

## 2015-01-05 DIAGNOSIS — Z800 Family history of malignant neoplasm of digestive organs: Secondary | ICD-10-CM

## 2015-01-05 DIAGNOSIS — K222 Esophageal obstruction: Secondary | ICD-10-CM | POA: Diagnosis not present

## 2015-01-05 DIAGNOSIS — K2100 Gastro-esophageal reflux disease with esophagitis, without bleeding: Secondary | ICD-10-CM

## 2015-01-05 DIAGNOSIS — Z86010 Personal history of colonic polyps: Secondary | ICD-10-CM | POA: Diagnosis not present

## 2015-01-05 DIAGNOSIS — R1314 Dysphagia, pharyngoesophageal phase: Secondary | ICD-10-CM

## 2015-01-05 DIAGNOSIS — D120 Benign neoplasm of cecum: Secondary | ICD-10-CM | POA: Diagnosis not present

## 2015-01-05 MED ORDER — SODIUM CHLORIDE 0.9 % IV SOLN: 500.0000 mL | INTRAVENOUS | Status: DC

## 2015-01-05 MED ORDER — PANTOPRAZOLE SODIUM 40 MG PO TBEC
40.0000 mg | DELAYED_RELEASE_TABLET | Freq: Every day | ORAL | Status: DC
Start: 2015-01-05 — End: 2019-01-17

## 2015-01-05 NOTE — Op Note (Signed)
[facility]  [address] [place], [id number]   COLONOSCOPY PROCEDURE REPORT  PATIENT: [name], [name]  MR#: [id number] BIRTHDATE: [birth year] , 54  yrs. old GENDER: female ENDOSCOPIST: Ladene Artist, MD, [facility] REFERRED BY:  Domenick Gong, M.D. PROCEDURE DATE:  01/05/2015 PROCEDURE:   Colonoscopy, screening and Colonoscopy with snare polypectomy First Screening Colonoscopy - Avg.  risk and is 50 yrs.  old or older - No.  Prior Negative Screening - Now for repeat screening. Less than 10 yrs Prior Negative Screening - Now for repeat screening.  Above average risk  History of Adenoma - Now for follow-up colonoscopy & has been > or = to 3 yrs.  N/A  Polyps removed today? Yes ASA CLASS:   Class II INDICATIONS:Surveillance due to prior colonic neoplasia and FH Colon or Rectal Adenocarcinoma. MEDICATIONS: Monitored anesthesia care and Propofol 230 mg IV DESCRIPTION OF PROCEDURE:   After the risks benefits and alternatives of the procedure were thoroughly explained, informed consent was obtained.  The digital rectal exam revealed no abnormalities of the rectum.   The LB [id number]  endoscope was introduced through the anus and advanced to the cecum, which was identified by both the appendix and ileocecal valve. No adverse events experienced.   The quality of the prep was excellent. (Suprep was used)  The instrument was then slowly withdrawn as the colon was fully examined. Estimated blood loss is zero unless otherwise noted in this procedure report.    COLON FINDINGS: A sessile polyp measuring 6 mm in size was found at the cecum.  A polypectomy was performed with a cold snare.  The resection was complete, the polyp tissue was completely retrieved and sent to histology.   The colonic mucosa appeared normal in the descending colon, at the splenic flexure, in the rectum, sigmoid colon, ascending colon, at the ileocecal valve, in the transverse colon, and at the  hepatic flexure.  Retroflexed views revealed no abnormalities. The time to cecum = 2.4 Withdrawal time = 10.7   The scope was withdrawn and the procedure completed.  COMPLICATIONS: There were no immediate complications.  ENDOSCOPIC IMPRESSION: 1.   Sessile polyp at the cecum; polypectomy performed with a cold snare 2.   The colonic mucosa otherwise appeared normal  RECOMMENDATIONS: 1.  Await pathology results 2.  Repeat Colonoscopy in 5 years.  eSigned:  Ladene Artist, MD, [facility] 01/05/2015 11:03 AM

## 2015-01-05 NOTE — Op Note (Signed)
[facility]  [address] [place], [address]   ENDOSCOPY PROCEDURE REPORT  PATIENT: [name], [name]  MR#: [id number] BIRTHDATE: 07/09/1960 , 54  yrs. old GENDER: female ENDOSCOPIST: Ladene Artist, MD, [facility] REFERRED BY:  Domenick Gong, M.D. PROCEDURE DATE:  01/05/2015 PROCEDURE:  EGD w/ wire guided (savary) dilation ASA CLASS:     Class II INDICATIONS:  history of esophageal reflux and dysphagia. MEDICATIONS: Monitored anesthesia care, Residual sedation present, and Propofol 170 mg IV TOPICAL ANESTHETIC: none DESCRIPTION OF PROCEDURE: After the risks benefits and alternatives of the procedure were thoroughly explained, informed consent was obtained.  The LB [id number] endoscope was introduced through the mouth and advanced to the second portion of the duodenum , Without limitations.  The instrument was slowly withdrawn as the mucosa was fully examined.    ESOPHAGUS: There was LA Class C esophagitis (Mucosal breaks continuous between > 2 mucosal folds, but involving less than 75% of the esophageal circumference) noted.   There was a 45mm long short friable and benign appearing stricture at the gastroesophageal junction.  The stricture was easily traversable. The stricture was dilated using 37mm, 60mm, 60mm  savary dilatorover guidewire.  Following this dilation, there was a small amount of heme and mild resistance with all 3 dilators. STOMACH: The mucosa of the stomach appeared normal. DUODENUM: The duodenal mucosa showed no abnormalities in the bulb and 2nd part of the duodenum.  Retroflexed views revealed a 5 cm hiatal hernia.     The scope was then withdrawn from the patient and the procedure completed.  COMPLICATIONS: There were no immediate complications.  ENDOSCOPIC IMPRESSION: 1.   LA Class C esophagitis 2.   Stricture at the gastroesophageal junction; dilated using savary dilator over guidewire 3.   5 cm hiatal  hernia  RECOMMENDATIONS: 1.  Anti-reflux regimen long term 2.  Post dilation instructions 3.  PPI qam long term: pantoprazole 40 mg po qam, 1 year of refills 4.  OP follow-up in 6-8 weeks.  eSigned:  Ladene Artist, MD, [facility] 01/05/2015 11:14 AM   [C

## 2015-01-05 NOTE — Progress Notes (Signed)
To recovery, replort to [place], RN, VSS 

## 2015-01-05 NOTE — Progress Notes (Signed)
Called to room to assist during endoscopic procedure.  Patient ID and intended procedure confirmed with present staff. Received instructions for my participation in the procedure from the performing physician.  

## 2015-01-05 NOTE — Patient Instructions (Signed)
YOU HAD AN ENDOSCOPIC PROCEDURE TODAY AT THE [facility]:   Refer to the procedure report that was given to you for any specific questions about what was found during the examination.  If the procedure report does not answer your questions, please call your gastroenterologist to clarify.  If you requested that your care partner not be given the details of your procedure findings, then the procedure report has been included in a sealed envelope for you to review at your convenience later.  YOU SHOULD EXPECT: Some feelings of bloating in the abdomen. Passage of more gas than usual.  Walking can help get rid of the air that was put into your GI tract during the procedure and reduce the bloating. If you had a lower endoscopy (such as a colonoscopy or flexible sigmoidoscopy) you may notice spotting of blood in your stool or on the toilet paper. If you underwent a bowel prep for your procedure, you may not have a normal bowel movement for a few days.  Please Note:  You might notice some irritation and congestion in your nose or some drainage.  This is from the oxygen used during your procedure.  There is no need for concern and it should clear up in a day or so.  SYMPTOMS TO REPORT IMMEDIATELY:   Following lower endoscopy (colonoscopy or flexible sigmoidoscopy):  Excessive amounts of blood in the stool  Significant tenderness or worsening of abdominal pains  Swelling of the abdomen that is new, acute  Fever of 100F or higher   Following upper endoscopy (EGD)  Vomiting of blood or coffee ground material  New chest pain or pain under the shoulder blades  Painful or persistently difficult swallowing  New shortness of breath  Fever of 100F or higher  Black, tarry-looking stools  For urgent or emergent issues, a gastroenterologist can be reached at any hour by calling [phone].   DIET: Your first meal following the procedure should be a small meal and then it is ok to progress to  your normal diet. Heavy or fried foods are harder to digest and may make you feel nauseous or bloated.  Likewise, meals heavy in dairy and vegetables can increase bloating.  Drink plenty of fluids but you should avoid alcoholic beverages for 24 hours.  ACTIVITY:  You should plan to take it easy for the rest of today and you should NOT DRIVE or use heavy machinery until tomorrow (because of the sedation medicines used during the test).    FOLLOW UP: Our staff will call the number listed on your records the next business day following your procedure to check on you and address any questions or concerns that you may have regarding the information given to you following your procedure. If we do not reach you, we will leave a message.  However, if you are feeling well and you are not experiencing any problems, there is no need to return our call.  We will assume that you have returned to your regular daily activities without incident.  If any biopsies were taken you will be contacted by phone or by letter within the next 1-3 weeks.  Please call us at [phone] if you have not heard about the biopsies in 3 weeks.    SIGNATURES/CONFIDENTIALITY: You and/or your care partner have signed paperwork which will be entered into your electronic medical record.  These signatures attest to the fact that that the information above on your After Visit Summary has been reviewed   and is understood.  Full responsibility of the confidentiality of this discharge information lies with you and/or your care-partner.YOU HAD AN ENDOSCOPIC PROCEDURE TODAY AT [facility]:   Refer to the procedure report that was given to you for any specific questions about what was found during the examination.  If the procedure report does not answer your questions, please call your gastroenterologist to clarify.  If you requested that your care partner not be given the details of your procedure findings, then the procedure  report has been included in a sealed envelope for you to review at your convenience later.  YOU SHOULD EXPECT: Some feelings of bloating in the abdomen. Passage of more gas than usual.  Walking can help get rid of the air that was put into your GI tract during the procedure and reduce the bloating. If you had a lower endoscopy (such as a colonoscopy or flexible sigmoidoscopy) you may notice spotting of blood in your stool or on the toilet paper. If you underwent a bowel prep for your procedure, you may not have a normal bowel movement for a few days.  Please Note:  You might notice some irritation and congestion in your nose or some drainage.  This is from the oxygen used during your procedure.  There is no need for concern and it should clear up in a day or so.  SYMPTOMS TO REPORT IMMEDIATELY:   Following lower endoscopy (colonoscopy or flexible sigmoidoscopy):  Excessive amounts of blood in the stool  Significant tenderness or worsening of abdominal pains  Swelling of the abdomen that is new, acute  Fever of 100F or higher   Following upper endoscopy (EGD)  Vomiting of blood or coffee ground material  New chest pain or pain under the shoulder blades  Painful or persistently difficult swallowing  New shortness of breath  Fever of 100F or higher  Black, tarry-looking stools  For urgent or emergent issues, a gastroenterologist can be reached at any hour by calling [phone].   DIET: Your first meal following the procedure should be a small meal and then it is ok to progress to your normal diet. Heavy or fried foods are harder to digest and may make you feel nauseous or bloated.  Likewise, meals heavy in dairy and vegetables can increase bloating.  Drink plenty of fluids but you should avoid alcoholic beverages for 24 hours.  ACTIVITY:  You should plan to take it easy for the rest of today and you should NOT DRIVE or use heavy machinery until tomorrow (because of the sedation  medicines used during the test).    FOLLOW UP: Our staff will call the number listed on your records the next business day following your procedure to check on you and address any questions or concerns that you may have regarding the information given to you following your procedure. If we do not reach you, we will leave a message.  However, if you are feeling well and you are not experiencing any problems, there is no need to return our call.  We will assume that you have returned to your regular daily activities without incident.  If any biopsies were taken you will be contacted by phone or by letter within the next 1-3 weeks.  Please call us at [phone] if you have not heard about the biopsies in 3 weeks.    SIGNATURES/CONFIDENTIALITY: You and/or your care partner have signed paperwork which will be entered into your electronic medical record.  These signatures attest to the fact that that the information above on your After Visit Summary has been reviewed and is understood.  Full responsibility of the confidentiality of this discharge information lies with you and/or your care-partner.  Next colonoscopy in 5 years. Dilation diet instructions given.  Please read Polyp, esophagitis, hiatal hernia, and dilation diet handouts given. Pantoprazole 40 mg every morning, follow up in 6-8 weeks.

## 2015-01-09 ENCOUNTER — Telehealth: Payer: Self-pay | Admitting: *Deleted

## 2015-01-09 NOTE — Telephone Encounter (Signed)
  Follow up Call-  Call back number 01/05/2015  Post procedure Call Back phone  # [phone]  Permission to leave phone message Yes   [facility]

## 2015-01-17 ENCOUNTER — Encounter: Payer: Self-pay | Admitting: Gastroenterology

## 2015-02-20 ENCOUNTER — Ambulatory Visit: Payer: BC Managed Care – PPO | Admitting: Gastroenterology

## 2015-03-12 ENCOUNTER — Ambulatory Visit (INDEPENDENT_AMBULATORY_CARE_PROVIDER_SITE_OTHER): Payer: BC Managed Care – PPO

## 2015-03-12 ENCOUNTER — Encounter: Payer: Self-pay | Admitting: Podiatry

## 2015-03-12 ENCOUNTER — Ambulatory Visit (INDEPENDENT_AMBULATORY_CARE_PROVIDER_SITE_OTHER): Payer: BC Managed Care – PPO | Admitting: Podiatry

## 2015-03-12 VITALS — BP 118/65 | HR 64 | Resp 16 | Ht 71.0 in | Wt 188.0 lb

## 2015-03-12 DIAGNOSIS — M722 Plantar fascial fibromatosis: Secondary | ICD-10-CM

## 2015-03-12 MED ORDER — TRIAMCINOLONE ACETONIDE 10 MG/ML IJ SUSP
10.0000 mg | Freq: Once | INTRAMUSCULAR | Status: AC
  Administered 2015-03-12: 10 mg

## 2015-03-12 MED ORDER — DICLOFENAC SODIUM 75 MG PO TBEC: 75.0000 mg | DELAYED_RELEASE_TABLET | Freq: Two times a day (BID) | ORAL | Status: DC

## 2015-03-12 NOTE — Patient Instructions (Signed)

## 2015-03-12 NOTE — Progress Notes (Signed)
Subjective:     Patient ID: [name], female   DOB: [birth year], 55 y.o.   MRN: [id number]  HPI patient states I've had a lot of pain in both of my heels and it's been very hard for me to walk for the last month   Review of Systems  All other systems reviewed and are negative.      Objective:   Physical Exam  Constitutional: She is oriented to person, place, and time.  Cardiovascular: Intact distal pulses.   Musculoskeletal: Normal range of motion.  Neurological: She is oriented to person, place, and time.  Skin: Skin is warm.  Nursing note and vitals reviewed.  neurovascular status intact muscle strength adequate range of motion within normal limits with patient found to have significant depression of the arch and exquisite discomfort in the plantar fascia bilateral at the insertion of the tendon into the calcaneus. Patient's found to have good digital perfusion and is well oriented 3     Assessment:     Acute plantar fasciitis left over right heel    Plan:     H&P and condition reviewed and recommended conservative treatment with long-term orthotic therapy due to flatfeet. Today I injected the plantar fascia bilateral 3 mg Kenalog 5 mg Xylocaine and applied fascial brace with instructions bilateral and placed on anti-inflammatory diclofenac which patient states she can tolerate well. Gave instructions on physical therapy and reappoint in 2 weeks  X-ray report indicate small spur left over right heel with no indications of stress fracture and flatfoot deformity

## 2015-03-12 NOTE — Progress Notes (Signed)
   Subjective:    Patient ID: [name], female    DOB: 03-Nov-1960, 55 y.o.   MRN: [id number]  HPI Patient presents with bilateral foot pain; heel. Pt stated, "foot hurts more when stationary and then get up; throbbing pain"; x1 month.  Pt also stated, "4th toes rub against shoes".   Review of Systems  All other systems reviewed and are negative.      Objective:   Physical Exam        Assessment & Plan:

## 2015-03-26 ENCOUNTER — Ambulatory Visit: Payer: BC Managed Care – PPO | Admitting: Podiatry

## 2015-04-23 ENCOUNTER — Ambulatory Visit: Payer: BC Managed Care – PPO | Admitting: Podiatry

## 2015-07-04 ENCOUNTER — Encounter: Payer: Self-pay | Admitting: Podiatry

## 2015-07-04 ENCOUNTER — Ambulatory Visit (INDEPENDENT_AMBULATORY_CARE_PROVIDER_SITE_OTHER): Payer: BC Managed Care – PPO | Admitting: Podiatry

## 2015-07-04 DIAGNOSIS — M722 Plantar fascial fibromatosis: Secondary | ICD-10-CM | POA: Diagnosis not present

## 2015-07-04 NOTE — Progress Notes (Signed)
Subjective:     Patient ID: [name], female   DOB: April 03, 1960, 55 y.o.   MRN: [id number]  HPI patient states that she's having severe pain with her left heel and no she needs to get it corrected and it did not respond to the injection treatment that we had done previously and she's tried numerous changes in shoe gear inserted therapy anti-inflammatories and other modalities. Patient states that she cannot take the pain anymore and she wants something definitive done   Review of Systems     Objective:   Physical Exam Neurovascular status intact muscle strength adequate range of motion within normal limits with patient found to have severe discomfort in the plantar heel left at the insertional point tendon into the calcaneus with moderate depression of the arch noted with inflammation and fluid buildup upon palpation    Assessment:     Acute plantar fasciitis left over right with inflammation and fluid of a chronic nature    Plan:     H&P conditions reviewed and discussed surgical intervention. Patient wants to get this corrected and I explained procedure and order to correct this going over all possible complications as listed and alternative treatments. She is opted for surgery and I did allow her to sign consent form understanding recovery can take approximate 6 months. I went ahead and dispensed air fracture walker today and due to long-standing nature problems in both heels I went ahead today and I did scanned for custom orthotic devices

## 2015-07-04 NOTE — Patient Instructions (Signed)
Pre-Operative Instructions  Congratulations, you have decided to take an important step to improving your quality of life.  You can be assured that the doctors of [facility] will be with you every step of the way.  1. Plan to be at the surgery center/hospital at least 1 (one) hour prior to your scheduled time unless otherwise directed by the surgical center/hospital staff.  You must have a responsible adult accompany you, remain during the surgery and drive you home.  Make sure you have directions to the surgical center/hospital and know how to get there on time. 2. For hospital based surgery you will need to obtain a history and physical form from your family physician within 1 month prior to the date of surgery- we will give you a form for you primary physician.  3. We make every effort to accommodate the date you request for surgery.  There are however, times where surgery dates or times have to be moved.  We will contact you as soon as possible if a change in schedule is required.   4. No Aspirin/Ibuprofen for one week before surgery.  If you are on aspirin, any non-steroidal anti-inflammatory medications (Mobic, Aleve, Ibuprofen) you should stop taking it 7 days prior to your surgery.  You make take Tylenol  For pain prior to surgery.  5. Medications- If you are taking daily heart and blood pressure medications, seizure, reflux, allergy, asthma, anxiety, pain or diabetes medications, make sure the surgery center/hospital is aware before the day of surgery so they may notify you which medications to take or avoid the day of surgery. 6. No food or drink after midnight the night before surgery unless directed otherwise by surgical center/hospital staff. 7. No alcoholic beverages 24 hours prior to surgery.  No smoking 24 hours prior to or 24 hours after surgery. 8. Wear loose pants or shorts- loose enough to fit over bandages, boots, and casts. 9. No slip on shoes, sneakers are best. 10. Bring  your boot with you to the surgery center/hospital.  Also bring crutches or a walker if your physician has prescribed it for you.  If you do not have this equipment, it will be provided for you after surgery. 11. If you have not been contracted by the surgery center/hospital by the day before your surgery, call to confirm the date and time of your surgery. 12. Leave-time from work may vary depending on the type of surgery you have.  Appropriate arrangements should be made prior to surgery with your employer. 13. Prescriptions will be provided immediately following surgery by your doctor.  Have these filled as soon as possible after surgery and take the medication as directed. 14. Remove nail polish on the operative foot. 15. Wash the night before surgery.  The night before surgery wash the foot and leg well with the antibacterial soap provided and water paying special attention to beneath the toenails and in between the toes.  Rinse thoroughly with water and dry well with a towel.  Perform this wash unless told not to do so by your physician.  Enclosed: 1 Ice pack (please put in freezer the night before surgery)   1 Hibiclens skin cleaner   Pre-op Instructions  If you have any questions regarding the instructions, do not hesitate to call our office.  [place]: [address] [place], [place] [address] [phone]  [place]: [address] [place], [place] [address] [phone]  [place]: [address]  [place], [place] [address] [phone]   Dr.   Shaquelle Hernon DPM, Dr. Matthew Wagoner DPM, Dr. M. Todd Hyatt DPM, Dr. Titorya Stover DPM 

## 2015-07-10 ENCOUNTER — Encounter: Payer: Self-pay | Admitting: Podiatry

## 2015-07-10 DIAGNOSIS — M722 Plantar fascial fibromatosis: Secondary | ICD-10-CM | POA: Diagnosis not present

## 2015-07-11 ENCOUNTER — Telehealth: Payer: Self-pay | Admitting: *Deleted

## 2015-07-11 MED ORDER — TRAMADOL HCL 50 MG PO TABS
50.0000 mg | ORAL_TABLET | Freq: Three times a day (TID) | ORAL | Status: DC | PRN
Start: 2015-07-11 — End: 2015-07-19

## 2015-07-11 MED ORDER — PROMETHAZINE HCL 25 MG PO TABS: 25.0000 mg | ORAL_TABLET | Freq: Three times a day (TID) | ORAL | Status: DC | PRN

## 2015-07-11 NOTE — Telephone Encounter (Addendum)
Post op courtesy call-Pt states the Oxycodone is killing her head andmaking nauseous, and she's trying to take Ibuprofen but it is not helping with the pain.  I reiterated the post op instructions to remain in the boot at all times, not to weight bear or dangle foot more than 15 mins/hour, keep dressing clean and dry until the 1st POV check.  I told pt I would check to see what medication Dr. Paulla Dolly may change to and call again. Dr. Paulla Dolly ordered Tramadol 50mg  #30 one tablet every 8 hours prn foot pain, may take OTC regular strength Tylenol in between doses, but not if taking Oxycodone or Hydrocodone, and phenergan for nausea. Orders to pt and [facility]. 08/06/2015-Pt states the surgical shoe she is using feels like she is walking on concrete. Pt asked if we have an insert to cushion, she couldn't get the OTC inserts to stay in the shoe.  I spoke with pt and instructed to put double sided carpet tape in the surgical shoe to hold the insert in place.  Pt states she took those back and may just need padding.  I told pt to come to the office before noon, or after 1:00pm to be fitted.  Pt states understanding. Pt presented to [facility] office for inserts to cushion her large darco surgery shoe.  I cut a 1/2" white foam, and 1/2" plastazote insert to match the insert in the darco shoe, and instructed pt to stablize in the [place] with double sided carpet tape, to try each to see which suited her purpose best. Pt liked the plastazote best, and took the white foam as well.

## 2015-07-19 ENCOUNTER — Encounter: Payer: Self-pay | Admitting: Podiatry

## 2015-07-19 ENCOUNTER — Ambulatory Visit (INDEPENDENT_AMBULATORY_CARE_PROVIDER_SITE_OTHER): Payer: BC Managed Care – PPO | Admitting: Podiatry

## 2015-07-19 VITALS — BP 149/87 | HR 58 | Resp 17

## 2015-07-19 DIAGNOSIS — M722 Plantar fascial fibromatosis: Secondary | ICD-10-CM | POA: Diagnosis not present

## 2015-07-19 DIAGNOSIS — Z9889 Other specified postprocedural states: Secondary | ICD-10-CM | POA: Diagnosis not present

## 2015-07-19 NOTE — Progress Notes (Signed)
Subjective:     Patient ID: [name], female   DOB: 1960-08-08, 55 y.o.   MRN: [id number]  HPI patient states I'm doing really well with my left foot   Review of Systems     Objective:   Physical Exam Neurovascular status intact negative Homans sign was noted with well-healing surgical site left medial and lateral heel with wound edges well coapted and minimal plantar pain    Assessment:     Doing well post endoscopic surgery left    Plan:     Advised on physical therapy reapplied sterile dressing and continue immobilization. Reappoint 2 weeks or earlier if needed

## 2015-08-02 ENCOUNTER — Encounter: Payer: Self-pay | Admitting: Podiatry

## 2015-08-10 ENCOUNTER — Ambulatory Visit (INDEPENDENT_AMBULATORY_CARE_PROVIDER_SITE_OTHER): Payer: BC Managed Care – PPO | Admitting: Podiatry

## 2015-08-10 DIAGNOSIS — Z9889 Other specified postprocedural states: Secondary | ICD-10-CM

## 2015-08-10 DIAGNOSIS — M722 Plantar fascial fibromatosis: Secondary | ICD-10-CM

## 2015-08-10 NOTE — Progress Notes (Signed)
This patient presents the office for weeks following an EPF on her left foot. She states there is an area in the arch that feels thickened and is uncomfortable when she walks. She says that the heel is doing well except for the incision site on the inside of her foot. She says she has tried wearing a tennis shoe but needed to return to her postop shoe. She presents the office for an evaluation of her foot following her surgery  Objective  neurovascular status is intact as prior to the surgery. The incision sites on the medial and lateral aspect of the left heel have healed well. There is mild swelling noted at the site of the medial incision. There is a thickened area under the arch of the left foot following the surgery.    S/P surgery left foot  ROV  her foot is healing as expected at this time frame, I proceeded to dispense a plantar fascial brace for her to wear on her left foot at the site of the thickened  area under the plantar fascia.  Patient told to allow the foot to continue to heal and remain in the surgical shoe until she can return to regular footgear  RTC 4 weeks.   Gardiner Barefoot DPM

## 2015-09-06 ENCOUNTER — Ambulatory Visit (INDEPENDENT_AMBULATORY_CARE_PROVIDER_SITE_OTHER): Payer: BC Managed Care – PPO | Admitting: Podiatry

## 2015-09-06 DIAGNOSIS — M722 Plantar fascial fibromatosis: Secondary | ICD-10-CM | POA: Diagnosis not present

## 2015-09-07 NOTE — Progress Notes (Signed)
Subjective:     Patient ID: [name], female   DOB: [birth year], 55 y.o.   MRN: [id number]  HPI patient presents stating that she is doing much better but she still has pain and she feels like her arch is not of proper height   Review of Systems     Objective:   Physical Exam Neurovascular status intact negative Homans sign noted with minimal discomfort plantar heel from endoscopic surgery and moderate pain in the arch from flattening of the arch    Assessment:     Doing much better from fascial surgery with moderate depression of the arch secondary to arch height    Plan:     H&P condition reviewed and at this point recommended orthotics to lift the arch. Patient will have orthotics made and was scanned today 1 be seen back to recheck

## 2015-10-11 NOTE — Progress Notes (Signed)
DOS 06.27.2017 Endoscopic Release Med. Band Left Heel

## 2015-10-17 ENCOUNTER — Ambulatory Visit (INDEPENDENT_AMBULATORY_CARE_PROVIDER_SITE_OTHER): Payer: BC Managed Care – PPO | Admitting: Podiatry

## 2015-10-17 ENCOUNTER — Encounter: Payer: Self-pay | Admitting: Podiatry

## 2015-10-17 DIAGNOSIS — M722 Plantar fascial fibromatosis: Secondary | ICD-10-CM

## 2015-10-18 NOTE — Progress Notes (Signed)
Subjective:     Patient ID: [name], female   DOB: Feb 12, 1960, 55 y.o.   MRN: [id number]  HPI patient presents to pickup orthotics stating she's doing some better   Review of Systems     Objective:   Physical Exam Neurovascular status intact with mild discomfort plantar with improvement with the previous surgery and stretching exercises    Assessment:     Continued improvement from fascial surgery    Plan:     Orthotics dispensed with all instructions on continued stretching exercises immobilization ice and heat therapy

## 2015-11-23 ENCOUNTER — Ambulatory Visit: Payer: BC Managed Care – PPO | Admitting: Podiatry

## 2016-01-14 HISTORY — PX: OTHER SURGICAL HISTORY: SHX169

## 2017-12-28 ENCOUNTER — Other Ambulatory Visit: Payer: Self-pay | Admitting: Surgery

## 2018-02-04 ENCOUNTER — Emergency Department (HOSPITAL_COMMUNITY): Payer: BC Managed Care – PPO

## 2018-02-04 ENCOUNTER — Emergency Department (HOSPITAL_COMMUNITY)
Admission: EM | Admit: 2018-02-04 | Discharge: 2018-02-04 | Disposition: A | Discharge status: Home / Self Care | Payer: BC Managed Care – PPO | Attending: Emergency Medicine | Admitting: Emergency Medicine

## 2018-02-04 ENCOUNTER — Encounter (HOSPITAL_COMMUNITY): Payer: Self-pay | Admitting: Internal Medicine

## 2018-02-04 DIAGNOSIS — Y92009 Unspecified place in unspecified non-institutional (private) residence as the place of occurrence of the external cause: Secondary | ICD-10-CM | POA: Diagnosis not present

## 2018-02-04 DIAGNOSIS — S82852A Displaced trimalleolar fracture of left lower leg, initial encounter for closed fracture: Secondary | ICD-10-CM | POA: Diagnosis not present

## 2018-02-04 DIAGNOSIS — Z79899 Other long term (current) drug therapy: Secondary | ICD-10-CM | POA: Insufficient documentation

## 2018-02-04 DIAGNOSIS — W19XXXA Unspecified fall, initial encounter: Secondary | ICD-10-CM

## 2018-02-04 DIAGNOSIS — Q899 Congenital malformation, unspecified: Secondary | ICD-10-CM

## 2018-02-04 DIAGNOSIS — W108XXA Fall (on) (from) other stairs and steps, initial encounter: Secondary | ICD-10-CM | POA: Insufficient documentation

## 2018-02-04 DIAGNOSIS — S82892A Other fracture of left lower leg, initial encounter for closed fracture: Secondary | ICD-10-CM

## 2018-02-04 DIAGNOSIS — S99912A Unspecified injury of left ankle, initial encounter: Secondary | ICD-10-CM | POA: Diagnosis present

## 2018-02-04 DIAGNOSIS — Y999 Unspecified external cause status: Secondary | ICD-10-CM | POA: Diagnosis not present

## 2018-02-04 DIAGNOSIS — Y939 Activity, unspecified: Secondary | ICD-10-CM | POA: Diagnosis not present

## 2018-02-04 HISTORY — DX: Other fracture of left lower leg, initial encounter for closed fracture: S82.892A

## 2018-02-04 LAB — BASIC METABOLIC PANEL
Anion gap: 7 (ref 5–15)
BUN: 6 mg/dL (ref 6–20)
CALCIUM: 8.8 mg/dL — AB (ref 8.9–10.3)
CO2: 28 mmol/L (ref 22–32)
CREATININE: 0.73 mg/dL (ref 0.44–1.00)
Chloride: 105 mmol/L (ref 98–111)
GFR calc Af Amer: >60 mL/min (ref >60)
Glucose, Bld: 130 mg/dL — ABNORMAL HIGH (ref 70–99)
Potassium: 3.6 mmol/L (ref 3.5–5.1)
Sodium: 140 mmol/L (ref 135–145)

## 2018-02-04 LAB — CBC WITH DIFFERENTIAL/PLATELET
Abs Immature Granulocytes: 0.03 10*3/uL (ref 0.00–0.07)
BASOS PCT: 0 %
Basophils Absolute: 0 10*3/uL (ref 0.0–0.1)
Eosinophils Absolute: 0.1 10*3/uL (ref 0.0–0.5)
Eosinophils Relative: 1 %
HCT: 39.2 % (ref 36.0–46.0)
Hemoglobin: 12.5 g/dL (ref 12.0–15.0)
Immature Granulocytes: 0 %
Lymphocytes Relative: 18 %
Lymphs Abs: 1.6 10*3/uL (ref 0.7–4.0)
MCH: 27.2 pg (ref 26.0–34.0)
MCHC: 31.9 g/dL (ref 30.0–36.0)
MCV: 85.4 fL (ref 80.0–100.0)
Monocytes Absolute: 0.5 10*3/uL (ref 0.1–1.0)
Monocytes Relative: 6 %
Neutro Abs: 6.6 10*3/uL (ref 1.7–7.7)
Neutrophils Relative %: 75 %
PLATELETS: 211 10*3/uL (ref 150–400)
RBC: 4.59 MIL/uL (ref 3.87–5.11)
RDW: 12.9 % (ref 11.5–15.5)
WBC: 8.8 10*3/uL (ref 4.0–10.5)
nRBC: 0 % (ref 0.0–0.2)

## 2018-02-04 MED ORDER — LIDOCAINE HCL (PF) 1 % IJ SOLN
5.0000 mL | Freq: Once | INTRAMUSCULAR | Status: AC
  Administered 2018-02-04: 5 mL
  Filled 2018-02-04: qty 5

## 2018-02-04 MED ORDER — HYDROCODONE-ACETAMINOPHEN 5-325 MG PO TABS: 1.0000 | ORAL_TABLET | Freq: Four times a day (QID) | ORAL | 0 refills | Status: DC | PRN

## 2018-02-04 MED ORDER — ONDANSETRON HCL 4 MG/2ML IJ SOLN
4.0000 mg | Freq: Once | INTRAMUSCULAR | Status: AC
  Administered 2018-02-04: 4 mg via INTRAVENOUS
  Filled 2018-02-04: qty 2

## 2018-02-04 MED ORDER — FENTANYL CITRATE (PF) 100 MCG/2ML IJ SOLN
100.0000 ug | Freq: Once | INTRAMUSCULAR | Status: AC
  Administered 2018-02-04: 100 ug via INTRAVENOUS
  Filled 2018-02-04: qty 2

## 2018-02-04 MED ORDER — ONDANSETRON HCL 4 MG/2ML IJ SOLN
INTRAMUSCULAR | Status: AC
  Administered 2018-02-04: 4 mg
  Filled 2018-02-04: qty 2

## 2018-02-04 MED ORDER — PROPOFOL 10 MG/ML IV BOLUS
1.0000 mg/kg | Freq: Once | INTRAVENOUS | Status: AC
  Administered 2018-02-04: 50 mg via INTRAVENOUS
  Filled 2018-02-04: qty 20

## 2018-02-04 MED ORDER — LIDOCAINE HCL (PF) 1 % IJ SOLN
INTRAMUSCULAR | Status: AC
  Filled 2018-02-04: qty 30

## 2018-02-04 MED ORDER — SODIUM CHLORIDE 0.9 % IV BOLUS
1000.0000 mL | Freq: Once | INTRAVENOUS | Status: AC
  Administered 2018-02-04: 1000 mL via INTRAVENOUS

## 2018-02-04 MED ORDER — LIDOCAINE HCL (PF) 1 % IJ SOLN
5.0000 mL | Freq: Once | INTRAMUSCULAR | Status: AC
  Administered 2018-02-04: 5 mL

## 2018-02-04 NOTE — ED Notes (Signed)
Portable xray @bedside

## 2018-02-04 NOTE — Discharge Planning (Signed)
[facility] printed ED note, DME order and face sheet for pt to present to [facility] DME store.

## 2018-02-04 NOTE — Discharge Instructions (Signed)
Keep splint intact and dry.  Do not put weight down on left leg.  Keep leg elevated whenever possible.  Ice over splint for 30 minutes 4x/day.

## 2018-02-04 NOTE — Progress Notes (Signed)
Orthopedic Tech Progress Note Patient Details:  [name] 1960-12-23 [id number] [facility] [address] did a reduction 2nd time and ED dr did it the 1st time. Needed to apply the new splint on with ORTHO Dr.   Manson Passey Devices Type of Ortho Device: Short leg splint, Stirrup splint Ortho Device/Splint Location: LLE Ortho Device/Splint Interventions: Adjustment, Application, Ordered   Post Interventions Patient Tolerated: Well Instructions Provided: Care of device, Adjustment of device   Janit Pagan 02/04/2018, 12:17 PM

## 2018-02-04 NOTE — Consult Note (Signed)
Reason for Consult:Left ankle fx Referring Physician: A Curatolo  [name] is an 58 y.o. female.  HPI: [name] was coming down her steps and slipped. She had immediate left ankle pain and could not get up or put weight on it. She came to the ED where x-rays confirmed an ankle fx/dislocation. She was reduced by EDP and orthopedic surgery was consulted. Unfortunately the reduction was inadequate so we will attempt again.  Past Medical History:  Diagnosis Date  . Anemia    History, no current problems  . Colon polyps   . Esophageal stricture   . GERD (gastroesophageal reflux disease)    diet controlled, no meds  . Hiatal hernia   . Left anterior fascicular hemiblock   . Migraine    last migraine 01/2014  . OA (osteoarthritis) of knee    right  . Tubal pregnancy 2005  . Tubular adenoma of colon 2016    Past Surgical History:  Procedure Laterality Date  . ABDOMINAL HYSTERECTOMY  2006   partial, for fibroids & bleeding  . COLONOSCOPY  08/2009   stark -polyp  . ESOPHAGOGASTRODUODENOSCOPY ENDOSCOPY  08/2009   Fuller Plan  . TUBAL LIGATION  2005  . WISDOM TOOTH EXTRACTION      Family History  Problem Relation Age of Onset  . Diabetes Father 72       type 2  . Cancer Father        carcinoma  . Colon cancer Father        late 18's  . Other Mother        Vitamin B12 deficiency/DJD-spine  . Lung cancer Brother   . Cancer Sister        unknown type  . Pancreatic cancer Paternal Grandfather   . Diabetes Paternal Grandmother   . Lung cancer Maternal Grandfather   . Esophageal cancer Maternal Grandfather   . Cancer Maternal Grandmother        abdominal  . Colon polyps Neg Hx   . Rectal cancer Neg Hx   . Stomach cancer Neg Hx     Social History:  reports that she has never smoked. She has never used smokeless tobacco. She reports that she does not drink alcohol or use drugs.  Allergies:  Allergies  Allergen Reactions  . Codeine Other (See Comments)    headaches  .  Oxycodone Nausea And Vomiting and Other (See Comments)    Headache  . Penicillins Other (See Comments)    REACTION: headache Did it involve swelling of the face/tongue/throat, SOB, or low BP? No Did it involve sudden or severe rash/hives, skin peeling, or any reaction on the inside of your mouth or nose? No Did you need to seek medical attention at a hospital or doctor's office? No When did it last happen?6 years If all above answers are "NO", may proceed with cephalosporin use.  Yeast infection    Medications: I have reviewed the patient's current medications.  Results for orders placed or performed during the hospital encounter of 02/04/18 (from the past 48 hour(s))  CBC with Differential     Status: None   Collection Time: 02/04/18  9:04 AM  Result Value Ref Range   WBC 8.8 4.0 - 10.5 K/uL   RBC 4.59 3.87 - 5.11 MIL/uL   Hemoglobin 12.5 12.0 - 15.0 g/dL   HCT 39.2 36.0 - 46.0 %   MCV 85.4 80.0 - 100.0 fL   MCH 27.2 26.0 - 34.0 pg   MCHC 31.9 30.0 -  36.0 g/dL   RDW 12.9 11.5 - 15.5 %   Platelets 211 150 - 400 K/uL   nRBC 0.0 0.0 - 0.2 %   Neutrophils Relative % 75 %   Neutro Abs 6.6 1.7 - 7.7 K/uL   Lymphocytes Relative 18 %   Lymphs Abs 1.6 0.7 - 4.0 K/uL   Monocytes Relative 6 %   Monocytes Absolute 0.5 0.1 - 1.0 K/uL   Eosinophils Relative 1 %   Eosinophils Absolute 0.1 0.0 - 0.5 K/uL   Basophils Relative 0 %   Basophils Absolute 0.0 0.0 - 0.1 K/uL   Immature Granulocytes 0 %   Abs Immature Granulocytes 0.03 0.00 - 0.07 K/uL    Comment: Performed at [facility] [address] [place], [place] [address]  Basic metabolic panel     Status: Abnormal   Collection Time: 02/04/18  9:04 AM  Result Value Ref Range   Sodium 140 135 - 145 mmol/L   Potassium 3.6 3.5 - 5.1 mmol/L   Chloride 105 98 - 111 mmol/L   CO2 28 22 - 32 mmol/L   Glucose, Bld 130 (H) 70 - 99 mg/dL   BUN 6 6 - 20 mg/dL   Creatinine, Ser 0.73 0.44 - 1.00 mg/dL   Calcium 8.8 (L) 8.9 -  10.3 mg/dL   GFR calc non Af Amer >60 >60 mL/min   GFR calc Af Amer >60 >60 mL/min   Anion gap 7 5 - 15    Comment: Performed at [facility], [place] [address], [place], [place] [address]    Dg Ankle 2 Views Left  Result Date: 02/04/2018 CLINICAL DATA:  Fall with pain and deformity in the left ankle EXAM: LEFT ANKLE - 2 VIEW COMPARISON:  None. FINDINGS: Oblique distal left fibular fracture with 24 mm lateral displacement of the distal fracture fragment, overriding of the fracture fragments and apex medial angulation. Transverse medial malleolar fracture with 1 cm lateral displacement of the distal fracture fragment. Posterior malleolar fracture of the distal left tibia with 24 mm lateral displacement of the posterior malleolar fracture fragment. Lateral dislocation of the talus at the left ankle joint. No focal osseous lesions. Diffuse soft tissue swelling in the left ankle. Tiny Achilles and plantar left calcaneal spurs. No radiopaque foreign bodies. IMPRESSION: Trimalleolar left ankle fracture-dislocation as detailed. Electronically Signed   By: Ilona Sorrel M.D.   On: 02/04/2018 09:02   Dg Ankle Complete Left  Result Date: 02/04/2018 CLINICAL DATA:  Left ankle fracture-dislocation post reduction. EXAM: LEFT ANKLE COMPLETE - 3+ VIEW COMPARISON:  Left ankle x-rays from same day. FINDINGS: Slightly improved alignment of the trimalleolar fracture-dislocation. Persistent lateral subluxation of the talus by 13 mm with respect to the distal tibia. 3.5 cm of overriding of the distal fibular fracture. IMPRESSION: Slightly improved alignment of the trimalleolar fracture-dislocation. Persistent lateral subluxation of the talus. Electronically Signed   By: Titus Dubin M.D.   On: 02/04/2018 09:59   Dg Ankle Complete Right  Result Date: 02/04/2018 CLINICAL DATA:  58 year old female with fall EXAM: RIGHT ANKLE - COMPLETE 3+ VIEW COMPARISON:  None. FINDINGS: There is a rounded calcific density at the  distal fibula on the anterior and the oblique image. This appears to have a well corticated margin, and does not appear to represent acute fracture, likely degenerative. There is absence of significant soft tissue swelling on the lateral ankle. Ankle mortise is congruent. Enthesopathic changes at the plantar fascia insertion. IMPRESSION: Negative for acute  bony abnormality. Degenerative changes of the hindfoot. Electronically Signed   By: Corrie Mckusick D.O.   On: 02/04/2018 09:02   Dg Foot 2 Views Left  Result Date: 02/04/2018 CLINICAL DATA:  Fall with left foot and ankle pain and deformity EXAM: LEFT FOOT - 2 VIEW COMPARISON:  03/12/2015 left foot radiographs FINDINGS: Trimalleolar left ankle fracture dislocation as better seen on separate concurrent left ankle radiographs. No additional fracture or dislocation in the left foot. No focal osseous lesions. Mild first MTP joint osteoarthritis. Small plantar left calcaneal spur. No radiopaque foreign bodies. IMPRESSION: 1. Trimalleolar left ankle fracture-dislocation, better seen on separate concurrent left ankle radiographs. 2. No additional fracture or dislocation in the left foot. 3. Mild left first MTP joint osteoarthritis. Electronically Signed   By: Ilona Sorrel M.D.   On: 02/04/2018 09:04    Review of Systems  Constitutional: Negative for weight loss.  HENT: Negative for ear discharge, ear pain, hearing loss and tinnitus.   Eyes: Negative for blurred vision, double vision, photophobia and pain.  Respiratory: Negative for cough, sputum production and shortness of breath.   Cardiovascular: Negative for chest pain.  Gastrointestinal: Negative for abdominal pain, nausea and vomiting.  Genitourinary: Negative for dysuria, flank pain, frequency and urgency.  Musculoskeletal: Positive for joint pain (Left ankle). Negative for back pain, falls, myalgias and neck pain.  Neurological: Positive for headaches. Negative for dizziness, tingling, sensory  change, focal weakness and loss of consciousness.  Endo/Heme/Allergies: Does not bruise/bleed easily.  Psychiatric/Behavioral: Negative for depression, memory loss and substance abuse. The patient is not nervous/anxious.    Blood pressure 116/72, pulse 70, temperature 98.2 F (36.8 C), temperature source Oral, resp. rate 15, height 5\' 11"  (1.803 m), weight 93.4 kg, SpO2 98 %. Physical Exam  Constitutional: She appears well-developed and well-nourished. No distress.  HENT:  Head: Normocephalic and atraumatic.  Eyes: Conjunctivae are normal. Right eye exhibits no discharge. Left eye exhibits no discharge. No scleral icterus.  Neck: Normal range of motion.  Cardiovascular: Normal rate and regular rhythm.  Respiratory: Effort normal. No respiratory distress.  Musculoskeletal:     Comments: LLE No traumatic wounds, ecchymosis, or rash  Short leg splint in place  No knee effusion  Knee stable to varus/ valgus and anterior/posterior stress  Sens DPN, SPN, TN intact  Motor EHL 5/5  Neurological: She is alert.  Skin: Skin is warm and dry. She is not diaphoretic.  Psychiatric: She has a normal mood and affect. Her behavior is normal.    Assessment/Plan: Left ankle fx/dislocation -- As long as can achieve acceptable reduction can discharge home with plans to see Dr. Stann Mainland in office to schedule ORIF once swelling has subsided. NWB on crutches.    Lisette Abu, PA-C Orthopedic Surgery [phone] 02/04/2018, 10:18 AM

## 2018-02-04 NOTE — Procedures (Signed)
Procedure: Left ankle closed reduction  Indication: Left ankle fracture/dislocation  Surgeon: Silvestre Gunner, PA-C  Assist: None  Anesthesia: Propofol via EDP  EBL: None  Complications: None  Findings: After risks/benefits explained patient desires to undergo procedure. Consent obtained and time out performed. Attempted reduction with regional block but unsuccessful. Sedation administered and ankle reduced and splinted in place. Post-reduction x-rays acceptable. Pt tolerated the procedure well.    Lisette Abu, PA-C Orthopedic Surgery [phone]

## 2018-02-04 NOTE — ED Triage Notes (Signed)
Pt BIB [facility] from home after tripping down a few steps this morning. Pt landed on carpeted landing at bottom of stairs. Obvious left ankle dislocation. No LOC, no blood thinners. Did not hit head. Given 241mcg fentanyl by EMS. GCS 15. VSS at this time.

## 2018-02-04 NOTE — ED Notes (Signed)
ED provider at the bedside with ortho techs applying splint.

## 2018-02-04 NOTE — Progress Notes (Signed)
Orthopedic Tech Progress Note Patient Details:  [name] Dec 04, 1960 [id number] ED nurse called and ask for a Knee Immobilizer Ortho Devices Type of Ortho Device: Knee Immobilizer Ortho Device/Splint Location: Right Leg  Ortho Device/Splint Interventions: Adjustment, Application, Ordered   Post Interventions Patient Tolerated: Well Instructions Provided: Adjustment of device, Care of device   [name] J Sky Primo 02/04/2018, 2:34 PM

## 2018-02-04 NOTE — Progress Notes (Signed)
Orthopedic Tech Progress Note Patient Details:  [name] 05/29/1960 [id number] SHE DID WELL  Ortho Devices Type of Ortho Device: Short leg splint, Stirrup splint Ortho Device/Splint Location: LLE Ortho Device/Splint Interventions: Adjustment, Application, Ordered   Post Interventions Patient Tolerated: Well Instructions Provided: Care of device, Adjustment of device   Janit Pagan 02/04/2018, 9:44 AM

## 2018-02-04 NOTE — ED Notes (Signed)
Patient verbalizes understanding of discharge instructions. Opportunity for questioning and answers were provided. Armband removed by staff, pt discharged from ED via wheelchair with RN to ED entrance for pt's husband to pick her up.

## 2018-02-04 NOTE — ED Notes (Signed)
Ortho tech bedside measuring crutches

## 2018-02-04 NOTE — ED Notes (Signed)
Patient transported to X-ray 

## 2018-02-04 NOTE — Sedation Documentation (Signed)
Orthopedic PA attempted nerve block and reduction without conscious sedation unsuccessfully. Dr. Ronnald Nian gave orders to give 50mg  propofol for closed reduction of left ankle with conscious sedation. Ortho tech bedside. Respiratory therapy bedside. End tidal CO2 monitoring present for patient.

## 2018-02-04 NOTE — Care Management (Signed)
ED CM received a call from the patient's husband stating they did not receive the order/prescription for the knee walker. Patient's husband agrees to a return call once CM is able to review the chart.   ED CM called the patient's husband. He states he went to the [facility] store today but they told him the did not have the order. Informed Mr. Wente the order was printed and given to the patient at discharge. CM described the order and the patient's husband was able to locate it. He states he will go to the [facility] store in the morning to pick up the knee walker. Venita Sheffield RN CCM

## 2018-02-04 NOTE — ED Provider Notes (Signed)
[facility] EMERGENCY DEPARTMENT Provider Note   CSN: [id number] Arrival date & time: 02/04/18  0805     History   Chief Complaint Chief Complaint  Patient presents with  . Ankle Pain    HPI Melita Villalona is a 58 y.o. female.  The history is provided by the patient.  Ankle Pain  Location:  Ankle Injury: yes   Mechanism of injury: fall   Ankle location:  L ankle Pain details:    Quality:  Aching and dull   Radiates to:  Does not radiate   Severity:  Mild   Onset quality:  Sudden   Timing:  Constant   Progression:  Improving Chronicity:  New Dislocation: yes   Prior injury to area:  No Relieved by:  Immobilization (fentanyl) Worsened by:  Nothing Associated symptoms: decreased ROM and swelling   Associated symptoms: no back pain, no fever, no itching, no muscle weakness, no neck pain, no numbness, no stiffness and no tingling     Past Medical History:  Diagnosis Date  . Anemia    History, no current problems  . Colon polyps   . Esophageal stricture   . GERD (gastroesophageal reflux disease)    diet controlled, no meds  . Hiatal hernia   . Left anterior fascicular hemiblock   . Migraine    last migraine 01/2014  . OA (osteoarthritis) of knee    right  . Tubal pregnancy 2005  . Tubular adenoma of colon 2016    Patient Active Problem List   Diagnosis Date Noted  . Dysphagia 08/31/2014  . Family history of colon cancer 08/31/2014  . Gastroesophageal reflux disease with esophagitis 08/31/2014  . Mass of skin of right shoulder 08/16/2013    Past Surgical History:  Procedure Laterality Date  . ABDOMINAL HYSTERECTOMY  2006   partial, for fibroids & bleeding  . COLONOSCOPY  08/2009   stark -polyp  . ESOPHAGOGASTRODUODENOSCOPY ENDOSCOPY  08/2009   [facility] Plan  . TUBAL LIGATION  2005  . WISDOM TOOTH EXTRACTION       OB History   No obstetric history on file.      Home Medications    Prior to Admission medications   Medication Sig  Start Date End Date Taking? Authorizing Provider  naproxen sodium (ALEVE) 220 MG tablet Take 440 mg by mouth as needed.   Yes [provider]  pantoprazole (PROTONIX) 40 MG tablet Take 1 tablet (40 mg total) by mouth daily. Patient not taking: Reported on 02/04/2018 01/05/15   Ladene Artist, MD    Family History Family History  Problem Relation Age of Onset  . Diabetes Father 50       type 2  . Cancer Father        carcinoma  . Colon cancer Father        late 68's  . Other Mother        Vitamin B12 deficiency/DJD-spine  . Lung cancer Brother   . Cancer Sister        unknown type  . Pancreatic cancer Paternal Grandfather   . Diabetes Paternal Grandmother   . Lung cancer Maternal Grandfather   . Esophageal cancer Maternal Grandfather   . Cancer Maternal Grandmother        abdominal  . Colon polyps Neg Hx   . Rectal cancer Neg Hx   . Stomach cancer Neg Hx     Social History Social History   Tobacco Use  . Smoking status: Never  Smoker  . Smokeless tobacco: Never Used  Substance Use Topics  . Alcohol use: No    Alcohol/week: 0.0 standard drinks  . Drug use: No     Allergies   Codeine; Oxycodone; and Penicillins   Review of Systems Review of Systems  Constitutional: Negative for chills and fever.  HENT: Negative for ear pain and sore throat.   Eyes: Negative for pain and visual disturbance.  Respiratory: Negative for cough and shortness of breath.   Cardiovascular: Negative for chest pain and palpitations.  Gastrointestinal: Negative for abdominal pain and vomiting.  Genitourinary: Negative for dysuria and hematuria.  Musculoskeletal: Positive for arthralgias and joint swelling. Negative for back pain, neck pain and stiffness.  Skin: Negative for color change, itching and rash.  Neurological: Negative for seizures and syncope.  All other systems reviewed and are negative.    Physical Exam Updated Vital Signs BP 132/90   Pulse 71   Temp 98.2 F  (36.8 C) (Oral)   Resp 14   Ht 5\' 11"  (1.803 m)   Wt 93.4 kg   SpO2 100%   BMI 28.73 kg/m   Physical Exam Vitals signs and nursing note reviewed.  Constitutional:      General: She is not in acute distress.    Appearance: She is well-developed.  HENT:     Head: Normocephalic and atraumatic.     Nose: Nose normal.     Mouth/Throat:     Mouth: Mucous membranes are moist.  Eyes:     Extraocular Movements: Extraocular movements intact.     Conjunctiva/sclera: Conjunctivae normal.     Pupils: Pupils are equal, round, and reactive to light.  Neck:     Musculoskeletal: Neck supple.  Cardiovascular:     Rate and Rhythm: Normal rate and regular rhythm.     Heart sounds: No murmur.  Pulmonary:     Effort: Pulmonary effort is normal. No respiratory distress.     Breath sounds: Normal breath sounds.  Abdominal:     Palpations: Abdomen is soft.     Tenderness: There is no abdominal tenderness.  Musculoskeletal: Normal range of motion.        General: Swelling, tenderness and deformity present.     Comments: TTP to left ankle with swelling and deformity  Skin:    General: Skin is warm and dry.     Capillary Refill: Capillary refill takes less than 2 seconds.  Neurological:     General: No focal deficit present.     Mental Status: She is alert and oriented to person, place, and time. Mental status is at baseline.     Cranial Nerves: No cranial nerve deficit.     Sensory: No sensory deficit.     Motor: No weakness.     Coordination: Coordination normal.      ED Treatments / Results  Labs (all labs ordered are listed, but only abnormal results are displayed) Labs Reviewed  BASIC METABOLIC PANEL - Abnormal; Notable for the following components:      Result Value   Glucose, Bld 130 (*)    Calcium 8.8 (*)    All other components within normal limits  CBC WITH DIFFERENTIAL/PLATELET    EKG None  Radiology Dg Ankle 2 Views Left  Result Date: 02/04/2018 CLINICAL DATA:   Fall with pain and deformity in the left ankle EXAM: LEFT ANKLE - 2 VIEW COMPARISON:  None. FINDINGS: Oblique distal left fibular fracture with 24 mm lateral displacement of the distal fracture  fragment, overriding of the fracture fragments and apex medial angulation. Transverse medial malleolar fracture with 1 cm lateral displacement of the distal fracture fragment. Posterior malleolar fracture of the distal left tibia with 24 mm lateral displacement of the posterior malleolar fracture fragment. Lateral dislocation of the talus at the left ankle joint. No focal osseous lesions. Diffuse soft tissue swelling in the left ankle. Tiny Achilles and plantar left calcaneal spurs. No radiopaque foreign bodies. IMPRESSION: Trimalleolar left ankle fracture-dislocation as detailed. Electronically Signed   By: Ilona Sorrel M.D.   On: 02/04/2018 09:02   Dg Ankle Complete Left  Result Date: 02/04/2018 CLINICAL DATA:  Left ankle fracture-dislocation post reduction. EXAM: LEFT ANKLE COMPLETE - 3+ VIEW COMPARISON:  Left ankle x-rays from same day. FINDINGS: Slightly improved alignment of the trimalleolar fracture-dislocation. Persistent lateral subluxation of the talus by 13 mm with respect to the distal tibia. 3.5 cm of overriding of the distal fibular fracture. IMPRESSION: Slightly improved alignment of the trimalleolar fracture-dislocation. Persistent lateral subluxation of the talus. Electronically Signed   By: Titus Dubin M.D.   On: 02/04/2018 09:59   Dg Ankle Complete Right  Result Date: 02/04/2018 CLINICAL DATA:  59 year old female with fall EXAM: RIGHT ANKLE - COMPLETE 3+ VIEW COMPARISON:  None. FINDINGS: There is a rounded calcific density at the distal fibula on the anterior and the oblique image. This appears to have a well corticated margin, and does not appear to represent acute fracture, likely degenerative. There is absence of significant soft tissue swelling on the lateral ankle. Ankle mortise is  congruent. Enthesopathic changes at the plantar fascia insertion. IMPRESSION: Negative for acute bony abnormality. Degenerative changes of the hindfoot. Electronically Signed   By: Corrie Mckusick D.O.   On: 02/04/2018 09:02   Dg Ankle Left Port  Result Date: 02/04/2018 CLINICAL DATA:  Post reduction. EXAM: PORTABLE LEFT ANKLE - 2 VIEW COMPARISON:  02/04/2018. FINDINGS: Patient splinted. Interim reduction of severe trimalleolar fracture dislocation left ankle. Slight displacement of fracture fragments remain. IMPRESSION: Interim reduction of trimalleolar fracture/dislocation. Slight disc placement of fracture fragments remain. Electronically Signed   By: Marcello Moores  Register   On: 02/04/2018 12:44   Dg Foot 2 Views Left  Result Date: 02/04/2018 CLINICAL DATA:  Fall with left foot and ankle pain and deformity EXAM: LEFT FOOT - 2 VIEW COMPARISON:  03/12/2015 left foot radiographs FINDINGS: Trimalleolar left ankle fracture dislocation as better seen on separate concurrent left ankle radiographs. No additional fracture or dislocation in the left foot. No focal osseous lesions. Mild first MTP joint osteoarthritis. Small plantar left calcaneal spur. No radiopaque foreign bodies. IMPRESSION: 1. Trimalleolar left ankle fracture-dislocation, better seen on separate concurrent left ankle radiographs. 2. No additional fracture or dislocation in the left foot. 3. Mild left first MTP joint osteoarthritis. Electronically Signed   By: Ilona Sorrel M.D.   On: 02/04/2018 09:04    Procedures .Ortho Injury Treatment Date/Time: 02/04/2018 12:30 PM Performed by: Lennice Sites, DO Authorized by: Lennice Sites, DO   Consent:    Consent obtained:  Verbal and written   Consent given by:  Patient   Risks discussed:  Fracture, irreducible dislocation, nerve damage, recurrent dislocation, stiffness, restricted joint movement and vascular damage   Alternatives discussed:  No treatment and immobilizationInjury location:  ankle Location details: left ankle Injury type: fracture-dislocation Fracture type: trimalleolar Pre-procedure neurovascular assessment: neurovascularly intact Pre-procedure distal perfusion: normal Pre-procedure neurological function: normal Pre-procedure range of motion: reduced Anesthesia: local infiltration  Anesthesia: Local anesthesia used:  yes Local Anesthetic: lidocaine 1% without epinephrine Anesthetic total: 15 mL  Patient sedated: Yes. Refer to sedation procedure documentation for details of sedation. Manipulation performed: yes Skin traction used: no Skeletal traction used: no Reduction successful: yes (partially) X-ray confirmed reduction: yes Immobilization: splint Post-procedure neurovascular assessment: post-procedure neurovascularly intact Post-procedure distal perfusion: normal Post-procedure neurological function: normal Post-procedure range of motion: improved Patient tolerance: Patient tolerated the procedure well with no immediate complications  .Sedation Date/Time: 02/04/2018 12:31 PM Performed by: Lennice Sites, DO Authorized by: Lennice Sites, DO   Consent:    Consent obtained:  Verbal and written   Consent given by:  Patient   Risks discussed:  Allergic reaction, dysrhythmia, inadequate sedation, nausea, prolonged hypoxia resulting in organ damage, prolonged sedation necessitating reversal, respiratory compromise necessitating ventilatory assistance and intubation and vomiting   Alternatives discussed:  Analgesia without sedation and anxiolysis Universal protocol:    Procedure explained and questions answered to patient or proxy's satisfaction: yes     Relevant documents present and verified: yes     Test results available and properly labeled: yes     Imaging studies available: yes     Immediately prior to procedure a time out was called: yes     Patient identity confirmation method:  Arm band Indications:    Procedure performed:  Fracture  reduction   Procedure necessitating sedation performed by:  Physician performing sedation Pre-sedation assessment:    Time since last food or drink:  Hours   ASA classification: class 1 - normal, healthy patient     Neck mobility: normal     Mouth opening:  3 or more finger widths   Thyromental distance:  4 finger widths   Mallampati score:  I - soft palate, uvula, fauces, pillars visible   Pre-sedation assessments completed and reviewed: airway patency not reviewed, cardiovascular function not reviewed, hydration status not reviewed, mental status not reviewed, nausea/vomiting not reviewed, pain level not reviewed, respiratory function not reviewed and temperature not reviewed   Immediate pre-procedure details:    Reassessment: Patient reassessed immediately prior to procedure     Reviewed: vital signs, relevant labs/tests and NPO status     Verified: bag valve mask available, emergency equipment available, intubation equipment available, IV patency confirmed, oxygen available, reversal medications available and suction available   Procedure details (see MAR for exact dosages):    Preoxygenation:  Nasal cannula   Sedation:  Propofol   Intra-procedure monitoring:  Blood pressure monitoring, cardiac monitor, continuous capnometry, continuous pulse oximetry, frequent LOC assessments and frequent vital sign checks   Intra-procedure events: none     Total Provider sedation time (minutes):  15 Post-procedure details:    Post-sedation assessment completed:  02/04/2018 12:32 PM   Attendance: Constant attendance by certified staff until patient recovered     Recovery: Patient returned to pre-procedure baseline     Post-sedation assessments completed and reviewed: airway patency not reviewed, cardiovascular function not reviewed, hydration status not reviewed, mental status not reviewed, nausea/vomiting not reviewed, pain level not reviewed, respiratory function not reviewed and temperature not  reviewed     Patient is stable for discharge or admission: yes     Patient tolerance:  Tolerated well, no immediate complications   (including critical care time)  Medications Ordered in ED Medications  ondansetron (ZOFRAN) 4 MG/2ML injection (4 mg  Given 02/04/18 0830)  lidocaine (PF) (XYLOCAINE) 1 % injection 5 mL (5 mLs Infiltration Given by Other 02/04/18 0907)  fentaNYL (SUBLIMAZE) injection 100 mcg (  100 mcg Intravenous Given 02/04/18 0921)  sodium chloride 0.9 % bolus 1,000 mL (0 mLs Intravenous Stopped 02/04/18 1211)  propofol (DIPRIVAN) 10 mg/mL bolus/IV push 93.4 mg (50 mg Intravenous Given 02/04/18 1203)  lidocaine (PF) (XYLOCAINE) 1 % injection 5 mL (5 mLs Infiltration Given 02/04/18 1159)  ondansetron (ZOFRAN) injection 4 mg (4 mg Intravenous Given 02/04/18 1200)  fentaNYL (SUBLIMAZE) injection 100 mcg (100 mcg Intravenous Given 02/04/18 1303)     Initial Impression / Assessment and Plan / ED Course  I have reviewed the triage vital signs and the nursing notes.  Pertinent labs & imaging results that were available during my care of the patient were reviewed by me and considered in my medical decision making (see chart for details).     Dallis Darden is a 58 year old female with no significant medical problems who presents to the ED with left ankle pain.  Patient with normal vitals.  No fever.  Patient slipped and injured her left ankle.  Did not hit her head.  No loss of consciousness.  Patient with obvious deformity to the left ankle.  Neurovascularly and neuromuscularly intact.  There is no open fracture.  X-ray confirmed bimalleolar fracture.  Initially patient had hematoma block performed and had reduction performed by myself.  Patient had improvement of alignment however mold did not hold with splint.  Therefore orthopedics came down and procedural sedation was done with propofol and orthopedics did further reduction which improved alignment fully.  Patient had no issues with  sedation.  Neurovascularly intact after reduction.  Patient ambulated well with crutches.  She was given prescription for Norco.  No active narcotic scripts on [employer].  Patient will follow-up with orthopedics outpatient.  Given return precautions.  Discharged in good condition.  This chart was dictated using voice recognition software.  Despite best efforts to proofread,  errors can occur which can change the documentation meaning.  Final Clinical Impressions(s) / ED Diagnoses   Final diagnoses:  Deformity  Fall  Closed left ankle fracture    ED Discharge Orders    None       Lennice Sites, DO 02/04/18 1415

## 2018-02-04 NOTE — Progress Notes (Signed)
RT was present for conscious sedation procedure.  Patient tolerated procedure well.  Sats maintained throughout.  No complications noted.

## 2018-02-12 ENCOUNTER — Encounter (HOSPITAL_BASED_OUTPATIENT_CLINIC_OR_DEPARTMENT_OTHER): Payer: Self-pay | Admitting: *Deleted

## 2018-02-12 ENCOUNTER — Other Ambulatory Visit: Payer: Self-pay

## 2018-02-12 NOTE — Progress Notes (Signed)
Spoke with [name] after midnight, arrive 530 am 02-18-2018 [facility] to take sip of water: hydrocodone prn Driver tarvio spouse Requested surgery orders from dr Stann Mainland via epic note Needs cbc

## 2018-02-12 NOTE — Progress Notes (Signed)
Need orders in epic for 2-6 surgery

## 2018-02-12 NOTE — Progress Notes (Signed)
PCP: richard tisovec  CARDIOLOGIST:none   INFO IN Epic:ekg 03-31-12  INFO ON CHART:  BLOOD THINNERS AND LAST DOSES:none ____________________________________  Patient surgery center same day labs cbc ordered, does ekg ned to be done? Patient has no hypertension or cardiac symptoms

## 2018-02-18 ENCOUNTER — Other Ambulatory Visit: Payer: Self-pay

## 2018-02-18 ENCOUNTER — Encounter (HOSPITAL_BASED_OUTPATIENT_CLINIC_OR_DEPARTMENT_OTHER)
Admission: RE | Disposition: A | Discharge status: Home / Self Care | Payer: Self-pay | Source: Home / Self Care | Attending: Orthopedic Surgery

## 2018-02-18 ENCOUNTER — Encounter (HOSPITAL_BASED_OUTPATIENT_CLINIC_OR_DEPARTMENT_OTHER): Payer: Self-pay

## 2018-02-18 ENCOUNTER — Ambulatory Visit (HOSPITAL_BASED_OUTPATIENT_CLINIC_OR_DEPARTMENT_OTHER): Payer: BC Managed Care – PPO | Admitting: Physician Assistant

## 2018-02-18 ENCOUNTER — Ambulatory Visit (HOSPITAL_BASED_OUTPATIENT_CLINIC_OR_DEPARTMENT_OTHER)
Admission: RE | Admit: 2018-02-18 | Discharge: 2018-02-18 | Disposition: A | Discharge status: Home / Self Care | Payer: BC Managed Care – PPO | Attending: Orthopedic Surgery | Admitting: Orthopedic Surgery

## 2018-02-18 DIAGNOSIS — W19XXXA Unspecified fall, initial encounter: Secondary | ICD-10-CM | POA: Diagnosis not present

## 2018-02-18 DIAGNOSIS — K219 Gastro-esophageal reflux disease without esophagitis: Secondary | ICD-10-CM | POA: Diagnosis not present

## 2018-02-18 DIAGNOSIS — M1990 Unspecified osteoarthritis, unspecified site: Secondary | ICD-10-CM | POA: Diagnosis not present

## 2018-02-18 DIAGNOSIS — S82892A Other fracture of left lower leg, initial encounter for closed fracture: Secondary | ICD-10-CM

## 2018-02-18 DIAGNOSIS — S82852A Displaced trimalleolar fracture of left lower leg, initial encounter for closed fracture: Secondary | ICD-10-CM | POA: Diagnosis present

## 2018-02-18 DIAGNOSIS — K449 Diaphragmatic hernia without obstruction or gangrene: Secondary | ICD-10-CM | POA: Insufficient documentation

## 2018-02-18 HISTORY — PX: ORIF ANKLE FRACTURE: SHX5408

## 2018-02-18 HISTORY — DX: Personal history of other diseases of the digestive system: Z87.19

## 2018-02-18 HISTORY — DX: Other fracture of left lower leg, initial encounter for closed fracture: S82.892A

## 2018-02-18 HISTORY — DX: Cardiac arrhythmia, unspecified: I49.9

## 2018-02-18 SURGERY — OPEN REDUCTION INTERNAL FIXATION (ORIF) ANKLE FRACTURE
Anesthesia: General | Site: Ankle | Laterality: Left

## 2018-02-18 MED ORDER — DEXAMETHASONE SODIUM PHOSPHATE 10 MG/ML IJ SOLN
INTRAMUSCULAR | Status: AC
  Filled 2018-02-18: qty 1

## 2018-02-18 MED ORDER — PROPOFOL 10 MG/ML IV BOLUS
INTRAVENOUS | Status: DC | PRN
  Administered 2018-02-18: 200 mg via INTRAVENOUS

## 2018-02-18 MED ORDER — ONDANSETRON HCL 4 MG/2ML IJ SOLN: INTRAMUSCULAR | Status: DC | PRN

## 2018-02-18 MED ORDER — EPHEDRINE SULFATE 50 MG/ML IJ SOLN
INTRAMUSCULAR | Status: DC | PRN
  Administered 2018-02-18: 10 mg via INTRAVENOUS
  Administered 2018-02-18: 5 mg via INTRAVENOUS
  Administered 2018-02-18: 10 mg via INTRAVENOUS

## 2018-02-18 MED ORDER — ONDANSETRON HCL 4 MG/2ML IJ SOLN
INTRAMUSCULAR | Status: AC
  Filled 2018-02-18: qty 2

## 2018-02-18 MED ORDER — ONDANSETRON HCL 4 MG/2ML IJ SOLN
INTRAMUSCULAR | Status: DC | PRN
  Administered 2018-02-18: 4 mg via INTRAVENOUS

## 2018-02-18 MED ORDER — LACTATED RINGERS IV SOLN
INTRAVENOUS | Status: DC
  Administered 2018-02-18: 50 mL/h via INTRAVENOUS
  Filled 2018-02-18: qty 1000

## 2018-02-18 MED ORDER — ONDANSETRON 4 MG PO TBDP: 4.0000 mg | ORAL_TABLET | Freq: Three times a day (TID) | ORAL | 0 refills | Status: DC | PRN

## 2018-02-18 MED ORDER — OXYCODONE HCL 5 MG PO TABS: 5.0000 mg | ORAL_TABLET | ORAL | 0 refills | Status: DC | PRN

## 2018-02-18 MED ORDER — FENTANYL CITRATE (PF) 100 MCG/2ML IJ SOLN
INTRAMUSCULAR | Status: AC
  Filled 2018-02-18: qty 4

## 2018-02-18 MED ORDER — SUCCINYLCHOLINE CHLORIDE 200 MG/10ML IV SOSY
PREFILLED_SYRINGE | INTRAVENOUS | Status: AC
  Filled 2018-02-18: qty 10

## 2018-02-18 MED ORDER — HYDROCODONE-ACETAMINOPHEN 7.5-325 MG PO TABS: 1.0000 | ORAL_TABLET | ORAL | 0 refills | Status: DC | PRN

## 2018-02-18 MED ORDER — PROPOFOL 10 MG/ML IV BOLUS
INTRAVENOUS | Status: AC
  Filled 2018-02-18: qty 40

## 2018-02-18 MED ORDER — FENTANYL CITRATE (PF) 100 MCG/2ML IJ SOLN
100.0000 ug | Freq: Once | INTRAMUSCULAR | Status: AC
  Administered 2018-02-18: 100 ug via INTRAVENOUS
  Filled 2018-02-18: qty 2

## 2018-02-18 MED ORDER — FENTANYL CITRATE (PF) 100 MCG/2ML IJ SOLN
INTRAMUSCULAR | Status: DC | PRN
  Administered 2018-02-18: 50 ug via INTRAVENOUS
  Administered 2018-02-18: 100 ug via INTRAVENOUS

## 2018-02-18 MED ORDER — KETOROLAC TROMETHAMINE 30 MG/ML IJ SOLN
INTRAMUSCULAR | Status: DC | PRN
  Administered 2018-02-18: 30 mg via INTRAVENOUS

## 2018-02-18 MED ORDER — DEXAMETHASONE SODIUM PHOSPHATE 10 MG/ML IJ SOLN
INTRAMUSCULAR | Status: DC | PRN
  Administered 2018-02-18: 5 mg via INTRAVENOUS

## 2018-02-18 MED ORDER — CHLORHEXIDINE GLUCONATE 4 % EX LIQD
60.0000 mL | Freq: Once | CUTANEOUS | Status: DC
  Filled 2018-02-18: qty 118

## 2018-02-18 MED ORDER — HYDROCODONE-ACETAMINOPHEN 5-325 MG PO TABS
1.0000 | ORAL_TABLET | Freq: Once | ORAL | Status: AC
  Administered 2018-02-18: 1 via ORAL
  Filled 2018-02-18: qty 1

## 2018-02-18 MED ORDER — FENTANYL CITRATE (PF) 100 MCG/2ML IJ SOLN
INTRAMUSCULAR | Status: AC
  Filled 2018-02-18: qty 2

## 2018-02-18 MED ORDER — LIDOCAINE-EPINEPHRINE 2 %-1:100000 IJ SOLN
INTRAMUSCULAR | Status: DC | PRN
  Administered 2018-02-18: 15 mL via PERINEURAL

## 2018-02-18 MED ORDER — MIDAZOLAM HCL 2 MG/2ML IJ SOLN
INTRAMUSCULAR | Status: AC
  Filled 2018-02-18: qty 2

## 2018-02-18 MED ORDER — OXYCODONE HCL 5 MG PO TABS
5.0000 mg | ORAL_TABLET | Freq: Once | ORAL | Status: DC
  Filled 2018-02-18: qty 1

## 2018-02-18 MED ORDER — CEFAZOLIN SODIUM-DEXTROSE 2-4 GM/100ML-% IV SOLN
2.0000 g | INTRAVENOUS | Status: AC
  Administered 2018-02-18: 2 g via INTRAVENOUS
  Filled 2018-02-18: qty 100

## 2018-02-18 MED ORDER — ROPIVACAINE HCL 5 MG/ML IJ SOLN
INTRAMUSCULAR | Status: DC | PRN
  Administered 2018-02-18: 25 mL via EPIDURAL

## 2018-02-18 MED ORDER — EPHEDRINE 5 MG/ML INJ
INTRAVENOUS | Status: AC
  Filled 2018-02-18: qty 10

## 2018-02-18 MED ORDER — SUCCINYLCHOLINE CHLORIDE 20 MG/ML IJ SOLN
INTRAMUSCULAR | Status: DC | PRN
  Administered 2018-02-18: 40 mg via INTRAVENOUS

## 2018-02-18 MED ORDER — WHITE PETROLATUM EX OINT
TOPICAL_OINTMENT | CUTANEOUS | Status: AC
  Filled 2018-02-18: qty 10

## 2018-02-18 MED ORDER — MIDAZOLAM HCL 2 MG/2ML IJ SOLN
2.0000 mg | Freq: Once | INTRAMUSCULAR | Status: AC
  Administered 2018-02-18: 2 mg via INTRAVENOUS
  Filled 2018-02-18: qty 2

## 2018-02-18 MED ORDER — OXYCODONE HCL 5 MG PO TABS
ORAL_TABLET | ORAL | Status: AC
  Filled 2018-02-18: qty 1

## 2018-02-18 MED ORDER — KETOROLAC TROMETHAMINE 30 MG/ML IJ SOLN
INTRAMUSCULAR | Status: AC
  Filled 2018-02-18: qty 1

## 2018-02-18 MED ORDER — HYDROCODONE-ACETAMINOPHEN 5-325 MG PO TABS
ORAL_TABLET | ORAL | Status: AC
  Filled 2018-02-18: qty 1

## 2018-02-18 MED ORDER — CEFAZOLIN SODIUM-DEXTROSE 2-4 GM/100ML-% IV SOLN
INTRAVENOUS | Status: AC
  Filled 2018-02-18: qty 100

## 2018-02-18 MED ORDER — CLONIDINE HCL (ANALGESIA) 100 MCG/ML EP SOLN
EPIDURAL | Status: DC | PRN
  Administered 2018-02-18: 70 ug

## 2018-02-18 MED ORDER — LIDOCAINE 2% (20 MG/ML) 5 ML SYRINGE
INTRAMUSCULAR | Status: DC | PRN
  Administered 2018-02-18: 60 mg via INTRAVENOUS
  Administered 2018-02-18: 100 mg via INTRAVENOUS

## 2018-02-18 SURGICAL SUPPLY — 84 items
BAG SPEC THK2 15X12 ZIP CLS (MISCELLANEOUS)
BAG ZIPLOCK 12X15 (MISCELLANEOUS) ×1 IMPLANT
BANDAGE ELASTIC 4 VELCRO ST LF (GAUZE/BANDAGES/DRESSINGS) ×2 IMPLANT
BANDAGE ELASTIC 6 VELCRO ST LF (GAUZE/BANDAGES/DRESSINGS) ×2 IMPLANT
BIT DRILL 2.5X110 QC LCP DISP (BIT) ×2 IMPLANT
BIT DRILL CANN 2.7X625 NONSTRL (BIT) ×2 IMPLANT
BIT DRILL QC 3.5X110 (BIT) ×2 IMPLANT
BNDG COHESIVE 4X5 TAN STRL (GAUZE/BANDAGES/DRESSINGS) ×3 IMPLANT
BNDG COHESIVE 6X5 TAN STRL LF (GAUZE/BANDAGES/DRESSINGS) ×3 IMPLANT
CLOSURE WOUND 1/2 X4 (GAUZE/BANDAGES/DRESSINGS) ×1
COVER SURGICAL LIGHT HANDLE (MISCELLANEOUS) ×3 IMPLANT
CUFF TOURN SGL QUICK 34 (TOURNIQUET CUFF) ×3
CUFF TRNQT CYL 34X4X40X1 (TOURNIQUET CUFF) ×1 IMPLANT
DRAPE C-ARM 42X120 X-RAY (DRAPES) ×3 IMPLANT
DRAPE INCISE IOBAN 66X45 STRL (DRAPES) ×3 IMPLANT
DRAPE SHEET LG 3/4 BI-LAMINATE (DRAPES) ×3 IMPLANT
DRAPE U-SHAPE 47X51 STRL (DRAPES) ×3 IMPLANT
DRSG EMULSION OIL 3X16 NADH (GAUZE/BANDAGES/DRESSINGS) ×1 IMPLANT
DURAPREP 26ML APPLICATOR (WOUND CARE) ×3 IMPLANT
ELECT REM PT RETURN 9FT ADLT (ELECTROSURGICAL) ×3
ELECTRODE REM PT RTRN 9FT ADLT (ELECTROSURGICAL) ×1 IMPLANT
GAUZE SPONGE 4X4 12PLY STRL (GAUZE/BANDAGES/DRESSINGS) ×4 IMPLANT
GAUZE XEROFORM 1X8 LF (GAUZE/BANDAGES/DRESSINGS) ×2 IMPLANT
GLOVE BIO SURGEON STRL SZ 6.5 (GLOVE) ×1 IMPLANT
GLOVE BIO SURGEON STRL SZ7 (GLOVE) ×2 IMPLANT
GLOVE BIO SURGEON STRL SZ7.5 (GLOVE) ×2 IMPLANT
GLOVE BIO SURGEONS STRL SZ 6.5 (GLOVE) ×1
GLOVE BIOGEL M 7.0 STRL (GLOVE) IMPLANT
GLOVE BIOGEL PI IND STRL 6.5 (GLOVE) IMPLANT
GLOVE BIOGEL PI IND STRL 7.0 (GLOVE) IMPLANT
GLOVE BIOGEL PI IND STRL 7.5 (GLOVE) ×1 IMPLANT
GLOVE BIOGEL PI IND STRL 8.5 (GLOVE) ×1 IMPLANT
GLOVE BIOGEL PI INDICATOR 6.5 (GLOVE) ×2
GLOVE BIOGEL PI INDICATOR 7.0 (GLOVE) ×2
GLOVE BIOGEL PI INDICATOR 7.5 (GLOVE) ×4
GLOVE BIOGEL PI INDICATOR 8.5 (GLOVE) ×2
GLOVE ECLIPSE 8.0 STRL XLNG CF (GLOVE) IMPLANT
GLOVE ORTHO TXT STRL SZ7.5 (GLOVE) ×6 IMPLANT
GLOVE SURG ORTHO 8.0 STRL STRW (GLOVE) ×3 IMPLANT
GOWN STRL REUS W/TWL LRG LVL3 (GOWN DISPOSABLE) ×3 IMPLANT
GOWN STRL REUS W/TWL XL LVL3 (GOWN DISPOSABLE) ×6 IMPLANT
GUIDEWARE NON THREAD 1.25X150 (WIRE) ×6
GUIDEWIRE NON THREAD 1.25X150 (WIRE) IMPLANT
IMMOBILIZER KNEE 20 (SOFTGOODS)
IMMOBILIZER KNEE 20 THIGH 36 (SOFTGOODS) ×1 IMPLANT
KIT BASIN OR (CUSTOM PROCEDURE TRAY) ×3 IMPLANT
MANIFOLD NEPTUNE II (INSTRUMENTS) ×1 IMPLANT
NS IRRIG 1000ML POUR BTL (IV SOLUTION) ×3 IMPLANT
PACK ORTHO EXTREMITY (CUSTOM PROCEDURE TRAY) ×3 IMPLANT
PAD ABD 8X10 STRL (GAUZE/BANDAGES/DRESSINGS) ×2 IMPLANT
PAD CAST 4YDX4 CTTN HI CHSV (CAST SUPPLIES) ×2 IMPLANT
PADDING CAST COTTON 4X4 STRL (CAST SUPPLIES) ×3
PLATE LCP 3.5 1/3 TUB 8HX93 (Plate) ×2 IMPLANT
POSITIONER SURGICAL ARM (MISCELLANEOUS) ×1 IMPLANT
SCREW CANC FT ST SFS 4X16 (Screw) ×4 IMPLANT
SCREW CORTEX 3.5 12MM (Screw) ×2 IMPLANT
SCREW CORTEX 3.5 14MM (Screw) ×4 IMPLANT
SCREW CORTEX 3.5 18MM (Screw) ×4 IMPLANT
SCREW LOCK CORT ST 3.5X12 (Screw) IMPLANT
SCREW LOCK CORT ST 3.5X14 (Screw) IMPLANT
SCREW LOCK CORT ST 3.5X18 (Screw) IMPLANT
SCREW SHORT THREAD 4.0X40 (Screw) ×4 IMPLANT
SET PAD KNEE POSITIONER (MISCELLANEOUS) ×3 IMPLANT
SPONGE LAP 18X18 RF (DISPOSABLE) ×2 IMPLANT
STOCKINETTE 8 INCH (MISCELLANEOUS) ×3 IMPLANT
STRIP CLOSURE SKIN 1/2X4 (GAUZE/BANDAGES/DRESSINGS) ×2 IMPLANT
SUCTION FRAZIER HANDLE 10FR (MISCELLANEOUS) ×2
SUCTION TUBE FRAZIER 10FR DISP (MISCELLANEOUS) ×1 IMPLANT
SUT ETHILON 3 0 PS 1 (SUTURE) ×4 IMPLANT
SUT ETHILON 4 0 PS 2 18 (SUTURE) ×3 IMPLANT
SUT MNCRL AB 4-0 PS2 18 (SUTURE) ×3 IMPLANT
SUT VIC AB 0 CT1 36 (SUTURE) ×2 IMPLANT
SUT VIC AB 1 CT1 27 (SUTURE) ×15
SUT VIC AB 1 CT1 27XBRD ANTBC (SUTURE) ×5 IMPLANT
SUT VIC AB 2-0 CT2 27 (SUTURE) ×3 IMPLANT
SUT VIC AB 2-0 SH 27 (SUTURE) ×3
SUT VIC AB 2-0 SH 27XBRD (SUTURE) IMPLANT
SYR BULB IRRIGATION 50ML (SYRINGE) ×2 IMPLANT
TOWEL OR 17X26 10 PK STRL BLUE (TOWEL DISPOSABLE) ×6 IMPLANT
WATER STERILE IRR 1000ML POUR (IV SOLUTION) ×3 IMPLANT
synthes 4.0 cannulated screw set ×1 IMPLANT
synthes 4.0 cannulated screw set/ ×2 IMPLANT
synthes4.0 cannulated screw set ×2 IMPLANT
syntheses ×1 IMPLANT

## 2018-02-18 NOTE — Anesthesia Procedure Notes (Signed)
Procedure Name: LMA Insertion Date/Time: 02/18/2018 7:45 AM Performed by: Wanita Chamberlain, CRNA Pre-anesthesia Checklist: Patient identified, Emergency Drugs available, Suction available and Patient being monitored Patient Re-evaluated:Patient Re-evaluated prior to induction Oxygen Delivery Method: Circle system utilized Preoxygenation: Pre-oxygenation with 100% oxygen Induction Type: IV induction Ventilation: Mask ventilation without difficulty LMA: LMA inserted LMA Size: 4.0 Number of attempts: 1 Placement Confirmation: breath sounds checked- equal and bilateral,  CO2 detector and positive ETCO2 Tube secured with: Tape Dental Injury: Teeth and Oropharynx as per pre-operative assessment

## 2018-02-18 NOTE — Anesthesia Procedure Notes (Signed)
Anesthesia Procedure Image    

## 2018-02-18 NOTE — Progress Notes (Signed)
Patient and spouse made aware of medication changes after speaking with Dr. Stann Mainland. Changes marked on discharge instructions.All questions answered and denies further questions. Walker delivered for patient and sent home with patient.

## 2018-02-18 NOTE — Anesthesia Postprocedure Evaluation (Signed)
Anesthesia Post Note  Patient: [name]  Procedure(s) Performed: OPEN REDUCTION INTERNAL FIXATION (ORIF) ANKLE FRACTURE (Left Ankle)     Patient location during evaluation: PACU Anesthesia Type: General Level of consciousness: awake and alert Pain management: pain level controlled Vital Signs Assessment: post-procedure vital signs reviewed and stable Respiratory status: spontaneous breathing, nonlabored ventilation, respiratory function stable and patient connected to nasal cannula oxygen Cardiovascular status: blood pressure returned to baseline and stable Postop Assessment: no apparent nausea or vomiting Anesthetic complications: no    Last Vitals:  Vitals:   02/18/18 1030 02/18/18 1045  BP: 128/70 124/72  Pulse: 74 76  Resp: 11 11  Temp:    SpO2: 94% 97%    Last Pain:  Vitals:   02/18/18 1045  TempSrc:   PainSc: 0-No pain                 Almando Brawley S

## 2018-02-18 NOTE — Discharge Instructions (Signed)
Orthopedic discharge instructions: -Maintain nonweightbearing to the left lower extremity with strict elevation "toes above nose."  At all times. -Keep splint clean and dry. -For pain control take Tylenol and Advil over-the-counter and then use oxycodone as needed for breakthrough pain. -For the prevention of blood clots take an 81 mg aspirin twice daily for 6 weeks. -Return to see Dr. Stann Mainland in 2 weeks for routine wound check.   Regional Anesthesia Blocks  1. Numbness or the inability to move the "blocked" extremity may last from 3-48 hours after placement. The length of time depends on the medication injected and your individual response to the medication. If the numbness is not going away after 48 hours, call your surgeon.  2. The extremity that is blocked will need to be protected until the numbness is gone and the  Strength has returned. Because you cannot feel it, you will need to take extra care to avoid injury. Because it may be weak, you may have difficulty moving it or using it. You may not know what position it is in without looking at it while the block is in effect.  3. For blocks in the legs and feet, returning to weight bearing and walking needs to be done carefully. You will need to wait until the numbness is entirely gone and the strength has returned. You should be able to move your leg and foot normally before you try and bear weight or walk. You will need someone to be with you when you first try to ensure you do not fall and possibly risk injury.  4. Bruising and tenderness at the needle site are common side effects and will resolve in a few days.  5. Persistent numbness or new problems with movement should be communicated to the surgeon or the [facility] [phone] [place] [phone]).Call your surgeon if you experience:   1.  Fever over 101.0. 2.  Inability to urinate. 3.  Nausea and/or vomiting. 4.  Extreme swelling or bruising at  the surgical site. 5.  Continued bleeding from the incision. 6.  Increased pain, redness or drainage from the incision. 7.  Problems related to your pain medication. 8.  Any problems and/or concerns. 9. Any changes to circulation in the left foot. Post Anesthesia Home Care Instructions  Activity: Get plenty of rest for the remainder of the day. A responsible individual must stay with you for 24 hours following the procedure.  For the next 24 hours, DO NOT: -Drive a car -Paediatric nurse -Drink alcoholic beverages -Take any medication unless instructed by your physician -Make any legal decisions or sign important papers.  Meals: Start with liquid foods such as gelatin or soup. Progress to regular foods as tolerated. Avoid greasy, spicy, heavy foods. If nausea and/or vomiting occur, drink only clear liquids until the nausea and/or vomiting subsides. Call your physician if vomiting continues.  Special Instructions/Symptoms: Your throat may feel dry or sore from the anesthesia or the breathing tube placed in your throat during surgery. If this causes discomfort, gargle with warm salt water. The discomfort should disappear within 24 hours.

## 2018-02-18 NOTE — Brief Op Note (Signed)
02/18/2018  9:22 AM  PATIENT:  [name]  58 y.o. female  PRE-OPERATIVE DIAGNOSIS:  Left ankle fracture  POST-OPERATIVE DIAGNOSIS:  Left ankle fracture  PROCEDURE:  Procedure(s): OPEN REDUCTION INTERNAL FIXATION (ORIF) ANKLE FRACTURE (Left)  SURGEON:  Surgeon(s) and Role:    * Nicholes Stairs, MD - Primary  PHYSICIAN ASSISTANT:   ASSISTANTS: none   ANESTHESIA:   regional and general  EBL:  20 cc  BLOOD ADMINISTERED:none  DRAINS: none   LOCAL MEDICATIONS USED:  NONE  SPECIMEN:  No Specimen  DISPOSITION OF SPECIMEN:  N/A  COUNTS:  YES  TOURNIQUET:   Total Tourniquet Time Documented: Thigh (Left) - 69 minutes Total: Thigh (Left) - 69 minutes   DICTATION: .Note written in EPIC  PLAN OF CARE: Discharge to home after PACU  PATIENT DISPOSITION:  PACU - hemodynamically stable.   Delay start of Pharmacological VTE agent (>24hrs) due to surgical blood loss or risk of bleeding: not applicable

## 2018-02-18 NOTE — Anesthesia Preprocedure Evaluation (Addendum)
Anesthesia Evaluation  Patient identified by MRN, date of birth, ID band Patient awake    Reviewed: Allergy & Precautions, NPO status , Patient's Chart, lab work & pertinent test results  Airway Mallampati: II  TM Distance: >3 FB Neck ROM: Full    Dental no notable dental hx.    Pulmonary neg pulmonary ROS,    Pulmonary exam normal breath sounds clear to auscultation       Cardiovascular negative cardio ROS Normal cardiovascular exam Rhythm:Regular Rate:Normal     Neuro/Psych  Headaches, negative psych ROS   GI/Hepatic Neg liver ROS, GERD  Medicated,  Endo/Other  negative endocrine ROS  Renal/GU negative Renal ROS  negative genitourinary   Musculoskeletal  (+) Arthritis , Osteoarthritis,    Abdominal   Peds negative pediatric ROS (+)  Hematology negative hematology ROS (+)   Anesthesia Other Findings   Reproductive/Obstetrics negative OB ROS                            Anesthesia Physical Anesthesia Plan  ASA: II  Anesthesia Plan: General   Post-op Pain Management:  Regional for Post-op pain   Induction: Intravenous  PONV Risk Score and Plan: 2 and Ondansetron, Dexamethasone and Treatment may vary due to age or medical condition  Airway Management Planned: LMA  Additional Equipment:   Intra-op Plan:   Post-operative Plan: Extubation in OR  Informed Consent: I have reviewed the patients History and Physical, chart, labs and discussed the procedure including the risks, benefits and alternatives for the proposed anesthesia with the patient or authorized representative who has indicated his/her understanding and acceptance.     Dental advisory given  Plan Discussed with: CRNA and Surgeon  Anesthesia Plan Comments:         Anesthesia Quick Evaluation

## 2018-02-18 NOTE — Op Note (Signed)
Date of Surgery: 02/18/2018  INDICATIONS: [name] is a 58 y.o.-year-old female who sustained a left ankle fracture; she was indicated for open reduction and internal fixation due to the displaced nature of the articular fracture and came to the operating room today for this procedure. The patient did consent to the procedure after discussion of the risks and benefits.  PREOPERATIVE DIAGNOSIS: left trimalleolar ankle fracture  POSTOPERATIVE DIAGNOSIS: Same.  PROCEDURE:  1. Open treatment of left ankle fracture with internal fixation Trimalleolar w/o fixation of posterior malleolus CPT 27822.  2.  Intraoperative fluoroscopy 3 views, AP, lateral, and mortise independently viewed and interpreted by me  SURGEON: Stellan Vick P. Stann Mainland, M.D.  ASSIST: None.  ANESTHESIA:  general, and popliteal block  TOURNIQUET TIME: 65 minutes at 300 mmHg  IV FLUIDS AND URINE: See anesthesia.  ESTIMATED BLOOD LOSS: 20 mL.  IMPLANTS: Synthes 4.0 mm cannulated screws x2 Synthes one third tubular 8 hole plate with 4.0 cancellus screws and 3.5 cortical screws  COMPLICATIONS: None.  DESCRIPTION OF PROCEDURE: The patient was brought to the operating room and placed supine on the operating table.  The patient had been signed prior to the procedure and this was documented. The patient had the anesthesia placed by the anesthesiologist.  A nonsterile tourniquet was placed on the upper thigh.  The prep verification and incision time-outs were performed to confirm that this was the correct patient, site, side and location. The patient had an SCD on the opposite lower extremity. The patient did receive antibiotics prior to the incision and was re-dosed during the procedure as needed at indicated intervals.  The patient had the lower extremity prepped and draped in the standard surgical fashion.  The extremity was exsanguinated using an esmarch bandage and the tourniquet was inflated to 300 mm Hg.   Incision was made over  the distal fibula and the fracture was exposed and reduced anatomically with a clamp. A lag screw was placed. I then applied a 1/3 tubular locking plate and secured it proximally and distally with non-locking screws. Bone quality was fair. I used c-arm to confirm satisfactory reduction and fixation.   I then turned my attention to the medial malleolus. Incision was made over the medial malleolus and the fracture exposed and held provisionally with a clamp. 2 guidepins were placed for the 4.0 mm cannulated screws and then confirmation of reduction was made with fluoroscopy. I then placed 2  45mm screws which had satisfactory fixation.   The syndesmosis was stressed using live fluoroscopy and found to be stable.   I then completed intraoperative fluoroscopy including AP, lateral, and mortise views to confirm adequacy of reduction and position and placement of orthopedic hardware.  The wounds were irrigated, and closed with vicryl with routine closure for the skin. The wounds were injected with local anesthetic. Sterile gauze was applied followed by a posterior splint. She was awakened and returned to the PACU in stable and satisfactory condition. There were no complications.  She was awakened from anesthesia.  All counts were correct x2.  POSTOPERATIVE PLAN: [name] will remain nonweightbearing on this leg for approximately 6 weeks; [name] will return for suture removal in 2 weeks.  He will be immobilized in a short leg splint and then transitioned to a CAM walker at his first follow up appointment.  [name] will receive DVT prophylaxis based on other medications, activity level, and risk ratio of bleeding to thrombosis.  Cassandra Rile, MD [facility] [place] [phone]  9:22 AM

## 2018-02-18 NOTE — Anesthesia Procedure Notes (Signed)
Anesthesia Regional Block: Femoral nerve block (saphenous nerve block at [facility])   Pre-Anesthetic Checklist: ,, timeout performed, Correct Patient, Correct Site, Correct Laterality, Correct Procedure, Correct Position, site marked, Risks and benefits discussed,  Surgical consent,  Pre-op evaluation,  At surgeon's request and post-op pain management  Laterality: Left  Prep: chloraprep       Needles:  Injection technique: Single-shot  Needle Type: Echogenic Needle     Needle Length: 5cm      Additional Needles:   Procedures:,,,, ultrasound used (permanent image in chart),,,,  Narrative:  Start time: 02/18/2018 7:23 AM End time: 02/18/2018 7:26 AM Injection made incrementally with aspirations every 5 mL.  Performed by: Personally  Anesthesiologist: Myrtie Soman, MD  Additional Notes: Patient tolerated the procedure well without complications

## 2018-02-18 NOTE — Anesthesia Procedure Notes (Signed)
Anesthesia Regional Block: Popliteal block   Pre-Anesthetic Checklist: ,, timeout performed, Correct Patient, Correct Site, Correct Laterality, Correct Procedure, Correct Position, site marked, Risks and benefits discussed,  Surgical consent,  Pre-op evaluation,  At surgeon's request and post-op pain management  Laterality: Left  Prep: chloraprep       Needles:  Injection technique: Single-shot  Needle Type: Echogenic Needle     Needle Length: 9cm      Additional Needles:   Procedures:,,,, ultrasound used (permanent image in chart),,,,  Narrative:  Start time: 02/18/2018 7:15 AM End time: 02/18/2018 7:23 AM Injection made incrementally with aspirations every 5 mL.  Performed by: Personally  Anesthesiologist: Myrtie Soman, MD  Additional Notes: Patient tolerated the procedure well without complications

## 2018-02-18 NOTE — H&P (Signed)
ORTHOPAEDIC H and P  REQUESTING PHYSICIAN: Nicholes Stairs, MD  PCP:  Haywood Pao, MD  Chief Complaint: Left ankle fracture  HPI: [name] is a 58 y.o. female who complains of left ankle fracture following a ground-level fall just under 2 weeks ago.  She presents today for open reduction and internal fixation.  She has no new complaints.  States her pain is improving.  She has been elevating and nonweightbearing.  Past Medical History:  Diagnosis Date  . Anemia    History, no current problems  . Closed left ankle fracture 02/04/2018  . Colon polyps   . Dysrhythmia    abnormal ekg left axis deviation   . Esophageal stricture   . GERD (gastroesophageal reflux disease)    diet controlled, no meds  . History of hiatal hernia   . Migraine    last migraine 01/2014  . OA (osteoarthritis) of knee    right  . Tubal pregnancy 2005  . Tubular adenoma of colon 2016   Past Surgical History:  Procedure Laterality Date  . ABDOMINAL HYSTERECTOMY  2006   partial, for fibroids & bleeding  . COLONOSCOPY  08/2009   stark -polyp  . ESOPHAGOGASTRODUODENOSCOPY ENDOSCOPY  08/2009   Fuller Plan esopagus stretched  . plantar fasciatis  2018   left  . TUBAL LIGATION  2005  . WISDOM TOOTH EXTRACTION     Social History   Socioeconomic History  . Marital status: Married    Spouse name: Marijean Heath  . Number of children: 2  . Years of education: Not on file  . Highest education level: Not on file  Occupational History  . Occupation: Pharmacist, hospital  Social Needs  . Financial resource strain: Not on file  . Food insecurity:    Worry: Not on file    Inability: Not on file  . Transportation needs:    Medical: Not on file    Non-medical: Not on file  Tobacco Use  . Smoking status: Never Smoker  . Smokeless tobacco: Never Used  Substance and Sexual Activity  . Alcohol use: No    Alcohol/week: 0.0 standard drinks  . Drug use: No  . Sexual activity: Yes    Birth control/protection:  Post-menopausal    Comment: Hysterectomy  Lifestyle  . Physical activity:    Days per week: Not on file    Minutes per session: Not on file  . Stress: Not on file  Relationships  . Social connections:    Talks on phone: Not on file    Gets together: Not on file    Attends religious service: Not on file    Active member of club or organization: Not on file    Attends meetings of clubs or organizations: Not on file    Relationship status: Not on file  Other Topics Concern  . Not on file  Social History Narrative  . Not on file   Family History  Problem Relation Age of Onset  . Diabetes Father 89       type 2  . Cancer Father        carcinoma  . Colon cancer Father        late 72's  . Other Mother        Vitamin B12 deficiency/DJD-spine  . Lung cancer Brother   . Cancer Sister        unknown type  . Pancreatic cancer Paternal Grandfather   . Diabetes Paternal Grandmother   . Lung cancer Maternal  Grandfather   . Esophageal cancer Maternal Grandfather   . Cancer Maternal Grandmother        abdominal  . Colon polyps Neg Hx   . Rectal cancer Neg Hx   . Stomach cancer Neg Hx    Allergies  Allergen Reactions  . Codeine Other (See Comments)    headaches  . Oxycodone Nausea And Vomiting and Other (See Comments)    Headache  . Penicillins Other (See Comments)    REACTION: headache Did it involve swelling of the face/tongue/throat, SOB, or low BP? No Did it involve sudden or severe rash/hives, skin peeling, or any reaction on the inside of your mouth or nose? No Did you need to seek medical attention at a hospital or doctor's office? No When did it last happen?6 years If all above answers are "NO", may proceed with cephalosporin use.  Yeast infection severe   Prior to Admission medications   Medication Sig Start Date End Date Taking? Authorizing Provider  HYDROcodone-acetaminophen (NORCO/VICODIN) 5-325 MG tablet Take 1 tablet by mouth every 6 (six) hours as  needed for up to 15 doses. 02/04/18  Yes Curatolo, Adam, DO  naproxen sodium (ALEVE) 220 MG tablet Take 440 mg by mouth as needed.   Yes [provider]  pantoprazole (PROTONIX) 40 MG tablet Take 1 tablet (40 mg total) by mouth daily. Patient not taking: Reported on 02/04/2018 01/05/15   Ladene Artist, MD   No results found.  Positive ROS: All other systems have been reviewed and were otherwise negative with the exception of those mentioned in the HPI and as above.  Physical Exam: General: Alert, no acute distress Cardiovascular: No pedal edema Respiratory: No cyanosis, no use of accessory musculature GI: No organomegaly, abdomen is soft and non-tender Skin: No lesions in the area of chief complaint Neurologic: Sensation intact distally Psychiatric: Patient is competent for consent with normal mood and affect Lymphatic: No axillary or cervical lymphadenopathy  MUSCULOSKELETAL:  Short leg splint in place.  Positive wrinkle sign.  Neurovascular intact to the left leg.  Assessment: Left ankle trimalleolar fracture, closed.  Plan: -Plan for open reduction internal fixation today. -She will be nonweightbearing placed in a short leg splint postoperatively.  She will discharge home from PACU. - The risks, benefits, and alternatives were discussed with the patient. There are risks associated with the surgery including, but not limited to, problems with anesthesia (death), infection, differences in leg length/angulation/rotation, fracture of bones, loosening or failure of implants, malunion, nonunion, hematoma (blood accumulation) which may require surgical drainage, blood clots, pulmonary embolism, nerve injury (foot drop), and blood vessel injury. The patient understands these risks and elects to proceed. -She will return to the office in 2 weeks for routine follow-up.    Nicholes Stairs, MD [facility] [phone]    02/18/2018 7:14 AM

## 2018-02-18 NOTE — Transfer of Care (Signed)
Immediate Anesthesia Transfer of Care Note  Patient: [name]  Procedure(s) Performed: OPEN REDUCTION INTERNAL FIXATION (ORIF) ANKLE FRACTURE (Left Ankle)  Patient Location: PACU  Anesthesia Type:General  Level of Consciousness: sedated, patient cooperative and responds to stimulation  Airway & Oxygen Therapy: Patient Spontanous Breathing and Patient connected to face mask oxygen  Post-op Assessment: Report given to RN and Post -op Vital signs reviewed and stable  Post vital signs: Reviewed and stable  Last Vitals:  Vitals Value Taken Time  BP    Temp    Pulse    Resp    SpO2      Last Pain:  Vitals:   02/18/18 0604  TempSrc: Oral  PainSc:       Patients Stated Pain Goal: 4 (74/71/59 5396)  Complications: No apparent anesthesia complications

## 2018-02-18 NOTE — Progress Notes (Signed)
0638 Time out with Dr. Kalman Shan and nurse. Versed 2 mg and Fentanyl 100 mcg IV given for sedation. 0727 Block completed . Tolerated well. Family at bedside

## 2018-02-19 ENCOUNTER — Encounter (HOSPITAL_BASED_OUTPATIENT_CLINIC_OR_DEPARTMENT_OTHER): Payer: Self-pay | Admitting: Orthopedic Surgery

## 2018-12-13 ENCOUNTER — Telehealth: Payer: Self-pay | Admitting: Gastroenterology

## 2018-12-13 NOTE — Telephone Encounter (Signed)
Dr. Fuller Plan, Please advise.  Best I can tell she has a personal hx of colon polyps and a family hx of colon cancer.

## 2018-12-13 NOTE — Telephone Encounter (Signed)
Patient is calling to schedule her colonoscopy. She is not due for a colonoscopy until 12/2019 and I let her know but she states that she would like to have it done now. She says she has a  history of colon cancer and did not want to wait the 5 years. Is it okay for her to have an early colon?

## 2018-12-13 NOTE — Telephone Encounter (Signed)
5 years would be the standard recall interval with history of a small adenomatous polyp and a first degree relative with colon cancer. If she is having any LGI signs or symptoms would proceed now. If she is not however she still wants to proceed with colonoscopy now we can schedule.

## 2018-12-13 NOTE — Telephone Encounter (Signed)
Patient wishes to proceed now.  She has been scheduled for colon, pre-visit, and COVID screen

## 2018-12-28 ENCOUNTER — Ambulatory Visit (AMBULATORY_SURGERY_CENTER): Payer: BC Managed Care – PPO | Admitting: *Deleted

## 2018-12-28 ENCOUNTER — Other Ambulatory Visit: Payer: Self-pay

## 2018-12-28 VITALS — Temp 98.0°F | Ht 71.0 in | Wt 208.0 lb

## 2018-12-28 DIAGNOSIS — Z86010 Personal history of colonic polyps: Secondary | ICD-10-CM

## 2018-12-28 DIAGNOSIS — Z800 Family history of malignant neoplasm of digestive organs: Secondary | ICD-10-CM

## 2018-12-28 DIAGNOSIS — Z1159 Encounter for screening for other viral diseases: Secondary | ICD-10-CM

## 2018-12-28 MED ORDER — NA SULFATE-K SULFATE-MG SULF 17.5-3.13-1.6 GM/177ML PO SOLN: ORAL | 0 refills | Status: DC

## 2018-12-28 NOTE — Progress Notes (Signed)
Patient is here in-person for PV. Patient denies any allergies to eggs or soy. Patient denies any problems with anesthesia/sedation. Patient denies any oxygen use at home. Patient denies taking any diet/weight loss medications or blood thinners. Patient is not being treated for MRSA or C-diff. EMMI education assisgned to the patient for the procedure, this was explained and instructions given to patient. COVID-19 screening test is on 12/21, the pt is aware. Pt is aware that care partner will wait in the car during procedure; if they feel like they will be too hot or cold to wait in the car; they may wait in the 4 th floor lobby. Patient is aware to bring only one care partner. We want them to wear a mask (we do not have any that we can provide them), practice social distancing, and we will check their temperatures when they get here.  I did remind the patient that their care partner needs to stay in the parking lot the entire time and have a cell phone available, we will call them when the pt is ready for discharge. Patient will wear mask into building.    Suprep $15 off coupon given to the patient.

## 2018-12-29 ENCOUNTER — Encounter: Payer: Self-pay | Admitting: Gastroenterology

## 2019-01-03 ENCOUNTER — Ambulatory Visit (INDEPENDENT_AMBULATORY_CARE_PROVIDER_SITE_OTHER): Payer: BC Managed Care – PPO

## 2019-01-03 DIAGNOSIS — Z1159 Encounter for screening for other viral diseases: Secondary | ICD-10-CM

## 2019-01-04 LAB — SARS CORONAVIRUS 2 (TAT 6-24 HRS): SARS Coronavirus 2: NEGATIVE

## 2019-01-06 ENCOUNTER — Other Ambulatory Visit: Payer: Self-pay

## 2019-01-06 ENCOUNTER — Encounter: Payer: Self-pay | Admitting: Gastroenterology

## 2019-01-06 ENCOUNTER — Ambulatory Visit (AMBULATORY_SURGERY_CENTER): Payer: BC Managed Care – PPO | Admitting: Gastroenterology

## 2019-01-06 VITALS — BP 111/69 | HR 52 | Temp 98.7°F | Resp 12 | Ht 70.0 in | Wt 208.0 lb

## 2019-01-06 DIAGNOSIS — K635 Polyp of colon: Secondary | ICD-10-CM

## 2019-01-06 DIAGNOSIS — Z800 Family history of malignant neoplasm of digestive organs: Secondary | ICD-10-CM

## 2019-01-06 DIAGNOSIS — Z86010 Personal history of colonic polyps: Secondary | ICD-10-CM

## 2019-01-06 DIAGNOSIS — D125 Benign neoplasm of sigmoid colon: Secondary | ICD-10-CM

## 2019-01-06 MED ORDER — SODIUM CHLORIDE 0.9 % IV SOLN: 500.0000 mL | Freq: Once | INTRAVENOUS | Status: DC

## 2019-01-06 NOTE — Progress Notes (Signed)
Report given to PACU, vss 

## 2019-01-06 NOTE — Op Note (Signed)
[facility] Patient Name: [name] Procedure Date: 01/06/2019 10:04 AM MRN: [id number] Endoscopist: Ladene Artist , MD Age: 58 Referring MD:  Date of Birth: 09-24-1960 Gender: Female Account #: [id number] Procedure:                Colonoscopy Indications:              Surveillance: Personal history of adenomatous                            polyps on last colonoscopy 5 years ago. Family                            history of colon cancer, first degree relative. Medicines:                Monitored Anesthesia Care Procedure:                Pre-Anesthesia Assessment:                           - Prior to the procedure, a History and Physical                            was performed, and patient medications and                            allergies were reviewed. The patient's tolerance of                            previous anesthesia was also reviewed. The risks                            and benefits of the procedure and the sedation                            options and risks were discussed with the patient.                            All questions were answered, and informed consent                            was obtained. Prior Anticoagulants: The patient has                            taken no previous anticoagulant or antiplatelet                            agents. ASA Grade Assessment: II - A patient with                            mild systemic disease. After reviewing the risks                            and benefits, the patient was deemed in  satisfactory condition to undergo the procedure.                           After obtaining informed consent, the colonoscope                            was passed under direct vision. Throughout the                            procedure, the patient's blood pressure, pulse, and                            oxygen saturations were monitored continuously. The                            Colonoscope was  introduced through the anus and                            advanced to the the cecum, identified by                            appendiceal orifice and ileocecal valve. The                            ileocecal valve, appendiceal orifice, and rectum                            were photographed. The quality of the bowel                            preparation was excellent. The colonoscopy was                            performed without difficulty. The patient tolerated                            the procedure well. Scope In: 10:17:18 AM Scope Out: 10:36:26 AM Scope Withdrawal Time: 0 hours 13 minutes 46 seconds  Total Procedure Duration: 0 hours 19 minutes 8 seconds  Findings:                 The perianal and digital rectal examinations were                            normal.                           Three sessile polyps were found in the sigmoid                            colon. The polyps were 6 to 7 mm in size. These                            polyps were removed with a cold snare. Resection  and retrieval were complete.                           The exam was otherwise without abnormality on                            direct and retroflexion views. Complications:            No immediate complications. Estimated blood loss:                            None. Estimated Blood Loss:     Estimated blood loss: none. Impression:               - Three 6 to 7 mm polyps in the sigmoid colon,                            removed with a cold snare. Resected and retrieved.                           - The examination was otherwise normal on direct                            and retroflexion views. Recommendation:           - Repeat colonoscopy after studies are complete for                            surveillance based on pathology results.                           - Patient has a contact number available for                            emergencies. The signs and symptoms of  potential                            delayed complications were discussed with the                            patient. Return to normal activities tomorrow.                            Written discharge instructions were provided to the                            patient.                           - Resume previous diet.                           - Continue present medications.                           - Await pathology results. Ladene Artist, MD 01/06/2019 10:40:14 AM This report has been signed  electronically.

## 2019-01-06 NOTE — Patient Instructions (Signed)
Handouts given:  Polyps Resume previous diet Continue current medications  Await pathology    OU HAD AN ENDOSCOPIC PROCEDURE TODAY AT [facility]:   Refer to the procedure report that was given to you for any specific questions about what was found during the examination.  If the procedure report does not answer your questions, please call your gastroenterologist to clarify.  If you requested that your care partner not be given the details of your procedure findings, then the procedure report has been included in a sealed envelope for you to review at your convenience later.  YOU SHOULD EXPECT: Some feelings of bloating in the abdomen. Passage of more gas than usual.  Walking can help get rid of the air that was put into your GI tract during the procedure and reduce the bloating. If you had a lower endoscopy (such as a colonoscopy or flexible sigmoidoscopy) you may notice spotting of blood in your stool or on the toilet paper. If you underwent a bowel prep for your procedure, you may not have a normal bowel movement for a few days.  Please Note:  You might notice some irritation and congestion in your nose or some drainage.  This is from the oxygen used during your procedure.  There is no need for concern and it should clear up in a day or so.  SYMPTOMS TO REPORT IMMEDIATELY:   Following lower endoscopy (colonoscopy or flexible sigmoidoscopy):  Excessive amounts of blood in the stool  Significant tenderness or worsening of abdominal pains  Swelling of the abdomen that is new, acute  Fever of 100F or higher For urgent or emergent issues, a gastroenterologist can be reached at any hour by calling [phone].   DIET:  We do recommend a small meal at first, but then you may proceed to your regular diet.  Drink plenty of fluids but you should avoid alcoholic beverages for 24 hours.  ACTIVITY:  You should plan to take it easy for the rest of today and you should NOT DRIVE or  use heavy machinery until tomorrow (because of the sedation medicines used during the test).    FOLLOW UP: Our staff will call the number listed on your records 48-72 hours following your procedure to check on you and address any questions or concerns that you may have regarding the information given to you following your procedure. If we do not reach you, we will leave a message.  We will attempt to reach you two times.  During this call, we will ask if you have developed any symptoms of COVID 19. If you develop any symptoms (ie: fever, flu-like symptoms, shortness of breath, cough etc.) before then, please call [phone].  If you test positive for Covid 19 in the 2 weeks post procedure, please call and report this information to [place].    If any biopsies were taken you will be contacted by phone or by letter within the next 1-3 weeks.  Please call us at [phone] if you have not heard about the biopsies in 3 weeks.    SIGNATURES/CONFIDENTIALITY: You and/or your care partner have signed paperwork which will be entered into your electronic medical record.  These signatures attest to the fact that that the information above on your After Visit Summary has been reviewed and is understood.  Full responsibility of the confidentiality of this discharge information lies with you and/or your care-partner.

## 2019-01-06 NOTE — Progress Notes (Signed)
Called to room to assist during endoscopic procedure.  Patient ID and intended procedure confirmed with present staff. Received instructions for my participation in the procedure from the performing physician.  

## 2019-01-06 NOTE — Progress Notes (Signed)
Temp by JB, VS by DT  Pt's states no medical or surgical changes since previsit or office visit. 

## 2019-01-11 ENCOUNTER — Telehealth: Payer: Self-pay

## 2019-01-11 NOTE — Telephone Encounter (Signed)
Left message on follow up call. 

## 2019-01-11 NOTE — Telephone Encounter (Signed)
  Follow up Call-  Call back number 01/06/2019  Post procedure Call Back phone  # [phone]  Permission to leave phone message Yes  Some recent data might be hidden     Patient questions:  Do you have a fever, pain , or abdominal swelling? No. Pain Score  0 *  Have you tolerated food without any problems? Yes.    Have you been able to return to your normal activities? Yes.    Do you have any questions about your discharge instructions: Diet   No. Medications  Yes.   Follow up visit  No.  Do you have questions or concerns about your Care? Yes.    Actions: * If pain score is 4 or above: No action needed, pain <4. 1. Have you developed a fever since your procedure? no  2.   Have you had an respiratory symptoms (SOB or cough) since your procedure? no  3.   Have you tested positive for COVID 19 since your procedure no  4.   Have you had any family members/close contacts diagnosed with the COVID 19 since your procedure?  No     If yes to any of these questions please route to Joylene John, RN and Alphonsa Gin, RN.

## 2019-01-12 NOTE — Telephone Encounter (Signed)
Protonix 40 mg po qd, 1 year of refills

## 2019-01-15 ENCOUNTER — Encounter: Payer: Self-pay | Admitting: Gastroenterology

## 2019-01-17 ENCOUNTER — Other Ambulatory Visit: Payer: Self-pay

## 2019-01-17 DIAGNOSIS — R1314 Dysphagia, pharyngoesophageal phase: Secondary | ICD-10-CM

## 2019-01-17 MED ORDER — PANTOPRAZOLE SODIUM 40 MG PO TBEC: 40.0000 mg | DELAYED_RELEASE_TABLET | Freq: Every day | ORAL | 3 refills | Status: DC

## 2019-05-29 IMAGING — CR DG ANKLE COMPLETE 3+V*L*
3 series · 3 of 3 positions shown · non-contrast
Comparison: Left ankle x-rays from same day.

CLINICAL DATA: Left ankle fracture-dislocation post reduction.

EXAM:
LEFT ANKLE COMPLETE - 3+ VIEW

[ankle ap]
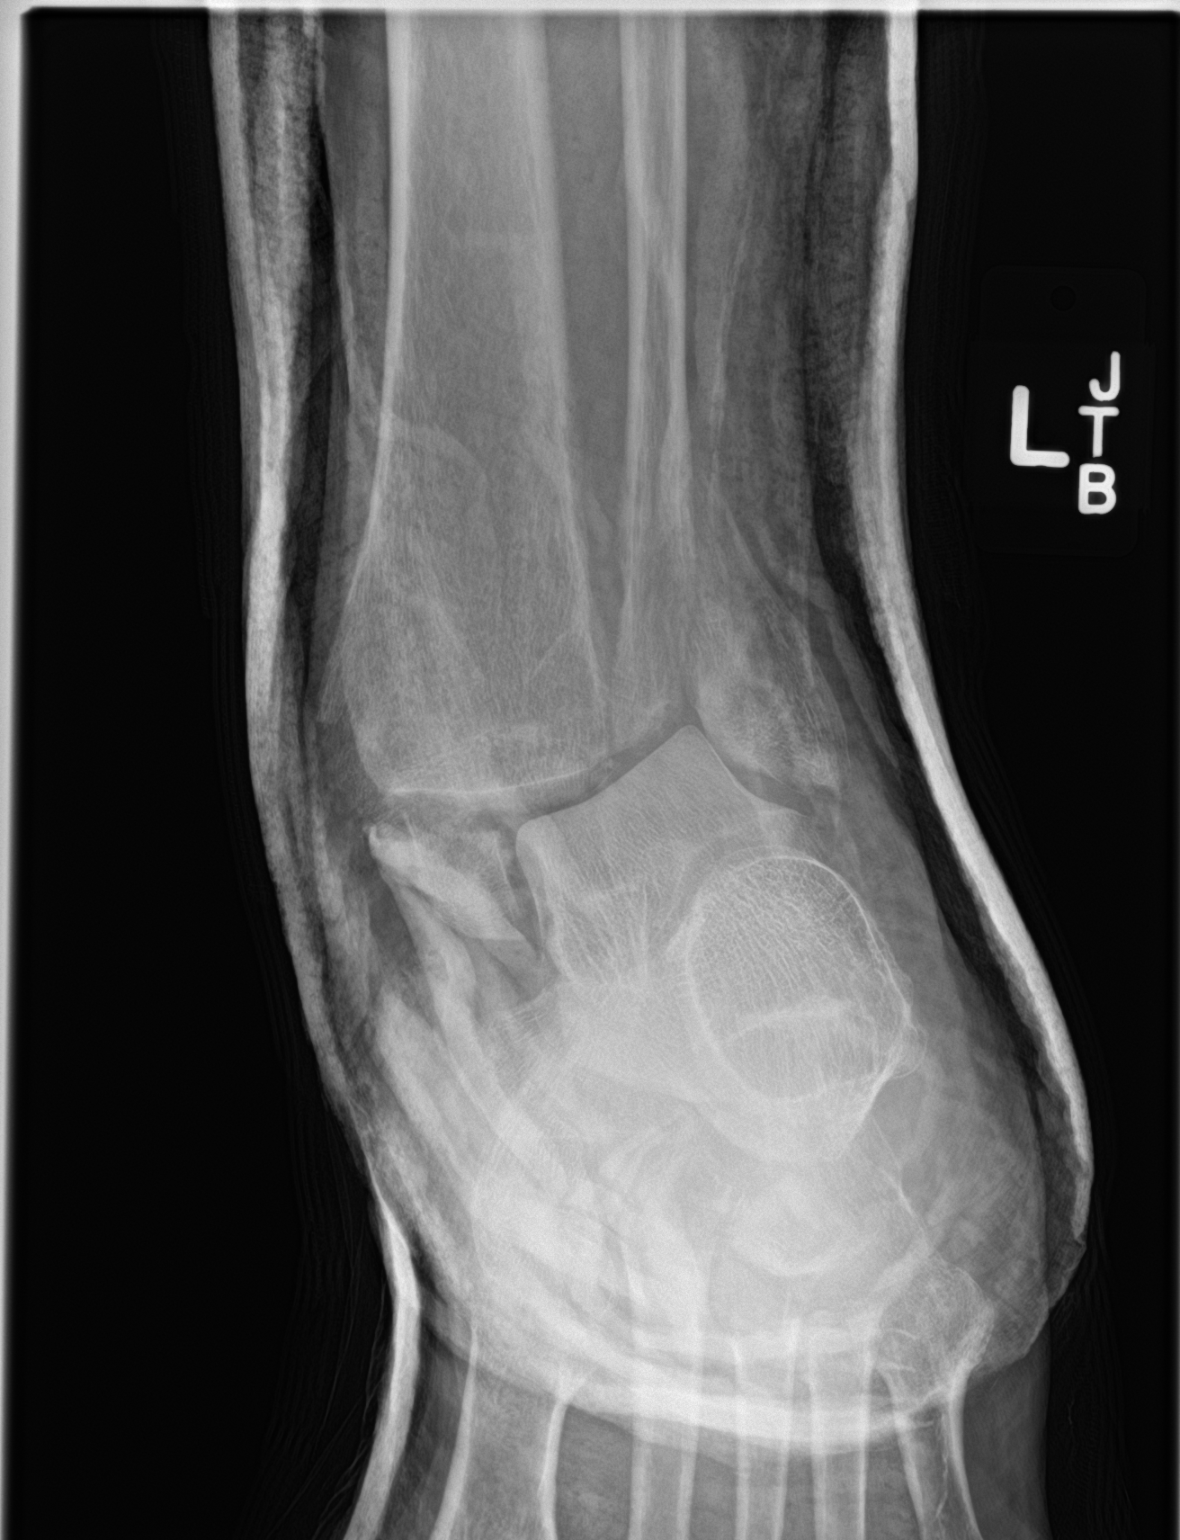

[ankle obl]
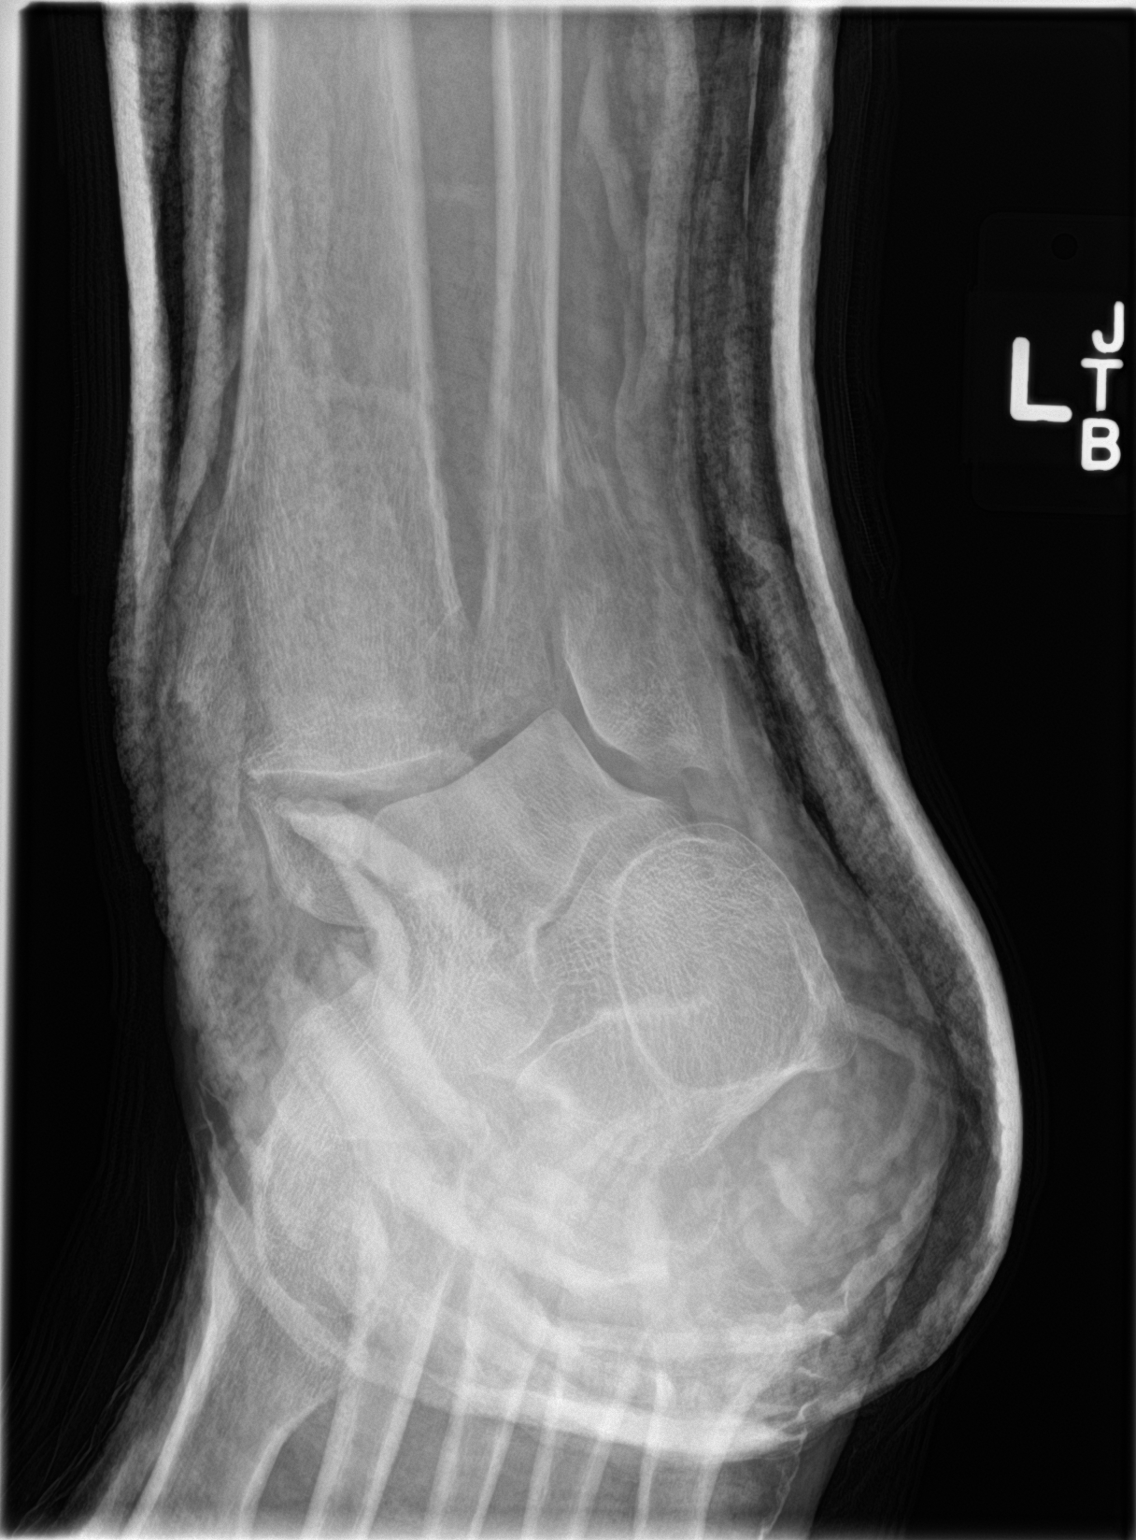

[ankle lat]
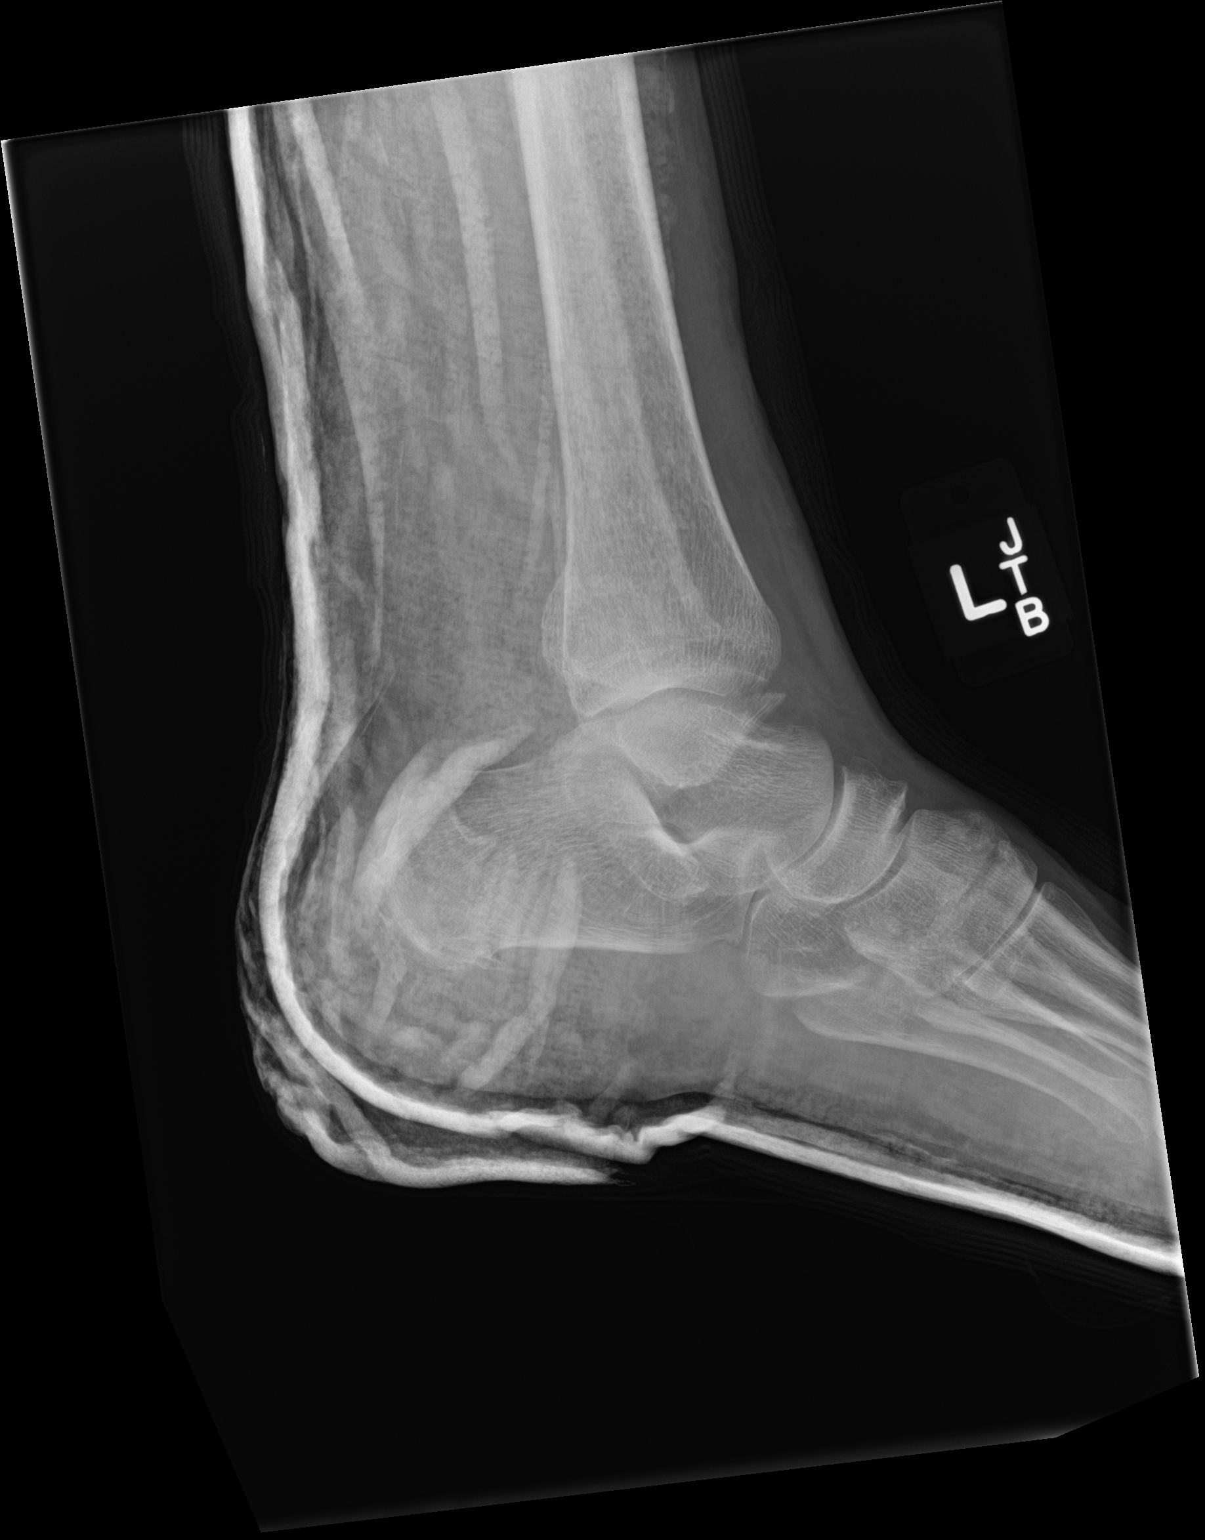

[3 of 3 positions shown; findings below may reference images not displayed]

FINDINGS: Slightly improved alignment of the trimalleolar
fracture-dislocation. Persistent lateral subluxation of the talus by
13 mm with respect to the distal tibia. 3.5 cm of overriding of the
distal fibular fracture.
IMPRESSION: Slightly improved alignment of the trimalleolar
fracture-dislocation. Persistent lateral subluxation of the talus.

## 2020-04-16 ENCOUNTER — Other Ambulatory Visit: Payer: Self-pay | Admitting: Obstetrics and Gynecology

## 2020-04-16 DIAGNOSIS — R928 Other abnormal and inconclusive findings on diagnostic imaging of breast: Secondary | ICD-10-CM

## 2020-05-04 ENCOUNTER — Other Ambulatory Visit: Payer: BC Managed Care – PPO

## 2020-05-21 ENCOUNTER — Other Ambulatory Visit: Payer: Self-pay | Admitting: Surgery

## 2020-06-04 ENCOUNTER — Other Ambulatory Visit: Payer: Self-pay | Admitting: Surgery

## 2021-06-11 ENCOUNTER — Telehealth: Payer: Self-pay | Admitting: Physician Assistant

## 2021-06-11 NOTE — Telephone Encounter (Signed)
Scheduled appt per 5/26 referral. Pt is aware of appt date and time. Pt is aware to arrive 15 mins prior to appt time and to bring and updated insurance card. Pt is aware of appt location.   

## 2021-06-25 NOTE — Progress Notes (Signed)
[place] [phone]   [phone]  INITIAL CONSULT NOTE  Patient Care Team: Tisovec, Fransico Him, MD as PCP - General (Internal Medicine)  Hematological/Oncological History 1)  Labs from Dr. Everlene Farrier, OB/GYN: -06/05/2021: WBC 9.7, Hgb 9.0 (L), MCV 70 (L), Plt 420  2) 06/26/2021: Establish care with [facility]ematology  CHIEF COMPLAINTS/PURPOSE OF CONSULTATION:  Microcytic anemia  HISTORY OF PRESENTING ILLNESS:  [name] 61 y.o. female presents to the hematology clinic for initial evaluation for microcytic anemia. She is unaccompanied for this visit.   On exam today, [name] reports that over the past year, she has experienced progressive fatigue. She has noticed improvement over the last 2-3 weeks since starting oral iron pills. Additionally, she has noticed shortness of breath with exertion that has improved. She denies any appetite changes to dietary restrictions. She denies nausea, vomiting or abdominal pain. She denies any bowel habit changes including diarrhea or constipation. She denies easy bruising or signs of active bleeding. She reports some episodes of dizziness but denies any syncopal episodes. She denies fevers, chills, drenching night sweats, chest pain or cough. She has no other complaints. Rest of the 10 point ROS is below.   MEDICAL HISTORY:  Past Medical History:  Diagnosis Date   Anemia    History, no current problems   Ankle fracture, left 02/18/2018   Closed left ankle fracture 02/04/2018   Colon polyps    Dysrhythmia    abnormal ekg left axis deviation    Esophageal stricture    GERD (gastroesophageal reflux disease)    diet controlled, no meds   History of hiatal hernia    Migraine    last migraine 01/2014   OA (osteoarthritis) of knee    right   Tubal pregnancy 2005   Tubular adenoma of colon 2016    SURGICAL HISTORY: Past Surgical History:  Procedure Laterality Date   ABDOMINAL HYSTERECTOMY  2006    partial, for fibroids & bleeding   COLONOSCOPY  08/2009,2016   stark -polyp   ESOPHAGOGASTRODUODENOSCOPY ENDOSCOPY  08/2009   Fuller Plan esopagus stretched   ORIF ANKLE FRACTURE Left 02/18/2018   Procedure: OPEN REDUCTION INTERNAL FIXATION (ORIF) ANKLE FRACTURE;  Surgeon: Nicholes Stairs, MD;  Location: [place];  Service: Orthopedics;  Laterality: Left;   plantar fasciatis  2018   left   TUBAL LIGATION  2005   UPPER GASTROINTESTINAL ENDOSCOPY  2016   WISDOM TOOTH EXTRACTION      SOCIAL HISTORY: Social History   Socioeconomic History   Marital status: Married    Spouse name: Tarvio   Number of children: 2   Years of education: Not on file   Highest education level: Not on file  Occupational History   Occupation: Pharmacist, hospital  Tobacco Use   Smoking status: Never   Smokeless tobacco: Never  Vaping Use   Vaping Use: Never used  Substance and Sexual Activity   Alcohol use: No    Alcohol/week: 0.0 standard drinks of alcohol   Drug use: No   Sexual activity: Yes    Birth control/protection: Post-menopausal    Comment: Hysterectomy  Other Topics Concern   Not on file  Social History Narrative   Not on file   Social Determinants of Health   Financial Resource Strain: Not on file  Food Insecurity: Not on file  Transportation Needs: Not on file  Physical Activity: Not on file  Stress: Not on file  Social Connections: Not on file  Intimate  Partner Violence: Not on file    FAMILY HISTORY: Family History  Problem Relation Age of Onset   Diabetes Father 69       type 2   Cancer Father        carcinoma   Colon cancer Father        late 39's   Other Mother        Vitamin B12 deficiency/DJD-spine   Lung cancer Brother    Cancer Sister        unknown type   Pancreatic cancer Paternal Grandfather    Diabetes Paternal Grandmother    Lung cancer Maternal Grandfather    Esophageal cancer Maternal Grandfather    Cancer Maternal Grandmother        abdominal    Colon polyps Neg Hx    Rectal cancer Neg Hx    Stomach cancer Neg Hx     ALLERGIES:  is allergic to codeine, hydrocodone, oxycodone, and penicillins.  MEDICATIONS:  Current Outpatient Medications  Medication Sig Dispense Refill   ACCRUFER 30 MG CAPS Take 1 capsule by mouth 2 (two) times daily.     naproxen sodium (ALEVE) 220 MG tablet Take 440 mg by mouth as needed.     pantoprazole (PROTONIX) 40 MG tablet Take 1 tablet (40 mg total) by mouth daily. 90 tablet 3   No current facility-administered medications for this visit.    REVIEW OF SYSTEMS:   Constitutional: ( - ) fevers, ( - )  chills , ( - ) night sweats Eyes: ( - ) blurriness of vision, ( - ) double vision, ( - ) watery eyes Ears, nose, mouth, throat, and face: ( - ) mucositis, ( - ) sore throat Respiratory: ( - ) cough, ( + ) dyspnea, ( - ) wheezes Cardiovascular: ( - ) palpitation, ( - ) chest discomfort, ( - ) lower extremity swelling Gastrointestinal:  ( - ) nausea, ( - ) heartburn, ( - ) change in bowel habits Skin: ( - ) abnormal skin rashes Lymphatics: ( - ) new lymphadenopathy, ( - ) easy bruising Neurological: ( - ) numbness, ( - ) tingling, ( - ) new weaknesses Behavioral/Psych: ( - ) mood change, ( - ) new changes  All other systems were reviewed with the patient and are negative.  PHYSICAL EXAMINATION: ECOG PERFORMANCE STATUS: 1 - Symptomatic but completely ambulatory  Vitals:   06/26/21 0918  BP: 115/60  Pulse: 76  Resp: 18  Temp: 97.9 F (36.6 C)  SpO2: 99%   Filed Weights   06/26/21 0918  Weight: 210 lb 14.4 oz (95.7 kg)    GENERAL: well appearing female in NAD  SKIN: skin color, texture, turgor are normal, no rashes or significant lesions EYES: conjunctiva are pink and non-injected, sclera clear OROPHARYNX: no exudate, no erythema; lips, buccal mucosa, and tongue normal  NECK: supple, non-tender LYMPH:  no palpable lymphadenopathy in the cervical or supraclavicular lymph nodes.  LUNGS:  clear to auscultation and percussion with normal breathing effort HEART: regular rate & rhythm and no murmurs and no lower extremity edema Musculoskeletal: no cyanosis of digits and no clubbing  PSYCH: alert & oriented x 3, fluent speech NEURO: no focal motor/sensory deficits  LABORATORY DATA:  I have reviewed the data as listed    Latest Ref Rng & Units 06/26/2021   10:01 AM 02/04/2018    9:04 AM 03/31/2012    9:21 PM  CBC  WBC 4.0 - 10.5 K/uL 5.8  8.8  8.3  Hemoglobin 12.0 - 15.0 g/dL 10.4  12.5  13.3   Hematocrit 36.0 - 46.0 % 33.0  39.2  37.9   Platelets 150 - 400 K/uL 313  211  183        Latest Ref Rng & Units 06/26/2021   10:01 AM 02/04/2018    9:04 AM 03/31/2012    9:21 PM  CMP  Glucose 70 - 99 mg/dL 99  130  110   BUN 6 - 20 mg/dL '8  6  11   '$ Creatinine 0.44 - 1.00 mg/dL 0.75  0.73  0.60   Sodium 135 - 145 mmol/L 141  140  140   Potassium 3.5 - 5.1 mmol/L 3.7  3.6  3.5   Chloride 98 - 111 mmol/L 106  105  104   CO2 22 - 32 mmol/L '30  28  25   '$ Calcium 8.9 - 10.3 mg/dL 9.1  8.8  9.3   Total Protein 6.5 - 8.1 g/dL 7.4     Total Bilirubin 0.3 - 1.2 mg/dL 0.6     Alkaline Phos 38 - 126 U/L 86     AST 15 - 41 U/L 18     ALT 0 - 44 U/L 14      ASSESSMENT & PLAN [name] is a 61 y.o. female who presents to the clinic for initial evaluation for microcytic anemia, most likely secondary to iron deficiency. Patient is currently taking oral iron supplementation with improvement of symptoms. However, the cost of the medication prevents her from continuing. Patient has tried ferrous sulfate in the past that caused nausea. She denies any signs of bleeding and has history of hysterectomy.   I recommend to proceed with repeat labs today to check CBC, CMP, ferritin, iron and TIBC. If there is persistent iron deficiency, we recommend to proceed with IV iron infusions. Additionally, we will request a follow up with Dr. Fuller Plan, gastroenterologist, to evaluate underlying cause of iron  deficiency.   #Iron deficiency anemia: --Etiology unknown. Patient denies any bleeding episodes.  --Need follow up with Dr. Fuller Plan, GI, to evaluate for malabsorption versus GI bleed --Currently on iron supplementation (Accrufer) daily but cannot afford it due to high copay --Plan to arrange IV iron if today's labs confirm persistent iron deficiency anemia --RTC in 8 weeks with repeat labs   Orders Placed This Encounter  Procedures   CBC with Differential ([facility] Only)    Standing Status:   Future    Number of Occurrences:   1    Standing Expiration Date:   06/26/2022   CMP ([place] only)    Standing Status:   Future    Number of Occurrences:   1    Standing Expiration Date:   06/26/2022   Ferritin    Standing Status:   Future    Number of Occurrences:   1    Standing Expiration Date:   06/26/2022   Immature Platelet Fraction    Standing Status:   Future    Number of Occurrences:   1    Standing Expiration Date:   06/26/2022   Retic Panel    Standing Status:   Future    Number of Occurrences:   1    Standing Expiration Date:   06/26/2022   Iron and Iron Binding Capacity ([facility] only)    Standing Status:   Future    Number of Occurrences:   1    Standing Expiration Date:   06/27/2022  All questions were answered. The patient knows to call the clinic with any problems, questions or concerns.  I have spent a total of 60 minutes minutes of face-to-face and non-face-to-face time, preparing to see the patient, obtaining and/or reviewing separately obtained history, performing a medically appropriate examination, counseling and educating the patient, ordering medications/tests, referring and communicating with other health care professionals, documenting clinical information in the electronic health record,  and care coordination.   Dede Query, PA-C Department of Hematology/Oncology [place] at [facility] Phone: [phone]  Patient was  seen with Dr. Lorenso Courier  I have read the above note and personally examined the patient. I agree with the assessment and plan as noted above.  Briefly [name] is a 61 year old female who presents for evaluation of microcytic anemia unclear etiology.  The patient will require further follow-up with GI in order to evaluate for possible GI bleed.  At this time would recommend IV iron therapy as the patient was unable to tolerate standard p.o. iron and is unable to afford Accrufer.  The patient voiced understanding of the plan moving forward.   Ledell Peoples, MD Department of Hematology/Oncology [place] at [facility] Phone: [phone] Pager: [phone] Email: [email or web address]

## 2021-06-26 ENCOUNTER — Other Ambulatory Visit: Payer: Self-pay

## 2021-06-26 ENCOUNTER — Inpatient Hospital Stay: Payer: BC Managed Care – PPO

## 2021-06-26 ENCOUNTER — Other Ambulatory Visit: Payer: Self-pay | Admitting: Pharmacy Technician

## 2021-06-26 ENCOUNTER — Telehealth: Payer: Self-pay

## 2021-06-26 ENCOUNTER — Telehealth: Payer: Self-pay | Admitting: Pharmacy Technician

## 2021-06-26 ENCOUNTER — Inpatient Hospital Stay: Payer: BC Managed Care – PPO | Attending: Physician Assistant | Admitting: Physician Assistant

## 2021-06-26 VITALS — BP 115/60 | HR 76 | Temp 97.9°F | Resp 18 | Ht 71.0 in | Wt 210.9 lb

## 2021-06-26 DIAGNOSIS — Z801 Family history of malignant neoplasm of trachea, bronchus and lung: Secondary | ICD-10-CM | POA: Diagnosis not present

## 2021-06-26 DIAGNOSIS — Z808 Family history of malignant neoplasm of other organs or systems: Secondary | ICD-10-CM | POA: Diagnosis not present

## 2021-06-26 DIAGNOSIS — Z800 Family history of malignant neoplasm of digestive organs: Secondary | ICD-10-CM

## 2021-06-26 DIAGNOSIS — D509 Iron deficiency anemia, unspecified: Secondary | ICD-10-CM | POA: Diagnosis present

## 2021-06-26 DIAGNOSIS — Z90710 Acquired absence of both cervix and uterus: Secondary | ICD-10-CM | POA: Diagnosis not present

## 2021-06-26 LAB — CMP (CANCER CENTER ONLY)
ALT: 14 U/L (ref 0–44)
AST: 18 U/L (ref 15–41)
Albumin: 3.9 g/dL (ref 3.5–5.0)
Alkaline Phosphatase: 86 U/L (ref 38–126)
Anion gap: 5 (ref 5–15)
BUN: 8 mg/dL (ref 6–20)
CO2: 30 mmol/L (ref 22–32)
Calcium: 9.1 mg/dL (ref 8.9–10.3)
Chloride: 106 mmol/L (ref 98–111)
Creatinine: 0.75 mg/dL (ref 0.44–1.00)
GFR, Estimated: >60 mL/min (ref >60)
Glucose, Bld: 99 mg/dL (ref 70–99)
Potassium: 3.7 mmol/L (ref 3.5–5.1)
Sodium: 141 mmol/L (ref 135–145)
Total Bilirubin: 0.6 mg/dL (ref 0.3–1.2)
Total Protein: 7.4 g/dL (ref 6.5–8.1)

## 2021-06-26 LAB — CBC WITH DIFFERENTIAL (CANCER CENTER ONLY)
Abs Immature Granulocytes: 0.01 10*3/uL (ref 0.00–0.07)
Basophils Absolute: 0 10*3/uL (ref 0.0–0.1)
Basophils Relative: 1 %
Eosinophils Absolute: 0.1 10*3/uL (ref 0.0–0.5)
Eosinophils Relative: 1 %
HCT: 33 % — ABNORMAL LOW (ref 36.0–46.0)
Hemoglobin: 10.4 g/dL — ABNORMAL LOW (ref 12.0–15.0)
Immature Granulocytes: 0 %
Lymphocytes Relative: 27 %
Lymphs Abs: 1.6 10*3/uL (ref 0.7–4.0)
MCH: 23.3 pg — ABNORMAL LOW (ref 26.0–34.0)
MCHC: 31.5 g/dL (ref 30.0–36.0)
MCV: 74 fL — ABNORMAL LOW (ref 80.0–100.0)
Monocytes Absolute: 0.4 10*3/uL (ref 0.1–1.0)
Monocytes Relative: 8 %
Neutro Abs: 3.7 10*3/uL (ref 1.7–7.7)
Neutrophils Relative %: 63 %
Platelet Count: 313 10*3/uL (ref 150–400)
RBC: 4.46 MIL/uL (ref 3.87–5.11)
RDW: 21.3 % — ABNORMAL HIGH (ref 11.5–15.5)
WBC Count: 5.8 10*3/uL (ref 4.0–10.5)
nRBC: 0 % (ref 0.0–0.2)

## 2021-06-26 LAB — RETIC PANEL
Immature Retic Fract: 16.5 % — ABNORMAL HIGH (ref 2.3–15.9)
RBC.: 4.42 MIL/uL (ref 3.87–5.11)
Retic Count, Absolute: 53 10*3/uL (ref 19.0–186.0)
Retic Ct Pct: 1.2 % (ref 0.4–3.1)
Reticulocyte Hemoglobin: 26.3 pg — ABNORMAL LOW (ref >27.9)

## 2021-06-26 LAB — FERRITIN: Ferritin: 7 ng/mL — ABNORMAL LOW (ref 11–307)

## 2021-06-26 LAB — IRON AND IRON BINDING CAPACITY (CC-WL,HP ONLY)
Iron: 32 ug/dL (ref 28–170)
Saturation Ratios: 7 % — ABNORMAL LOW (ref 10.4–31.8)
TIBC: 484 ug/dL — ABNORMAL HIGH (ref 250–450)
UIBC: 452 ug/dL — ABNORMAL HIGH (ref 148–442)

## 2021-06-26 LAB — IMMATURE PLATELET FRACTION: Immature Platelet Fraction: 6.3 % (ref 1.2–8.6)

## 2021-06-26 NOTE — Telephone Encounter (Signed)
Yes, patient will be scheduled as soon as possible. Ty. 

## 2021-06-26 NOTE — Telephone Encounter (Signed)
Dr. Charlies Silvers,  Monoferric is non preferred medication for [facility] and will likely be be denied due to patient has not tried and or failed step therapy.  Venofer Infed Ferrlecit  Would you like to try venofer?

## 2021-06-26 NOTE — Telephone Encounter (Signed)
Pt advised and verbalized understanding.  Her insurance expires on 6/30 and will need before then.

## 2021-06-26 NOTE — Telephone Encounter (Signed)
-----   Message from Lincoln Brigham, PA-C sent at 06/26/2021  1:27 PM EDT ----- Please notify patient that hgb has improved some but there is still iron deficiency. Recommend to proceed with IV iron infusions to improve levels.

## 2021-06-27 ENCOUNTER — Ambulatory Visit (INDEPENDENT_AMBULATORY_CARE_PROVIDER_SITE_OTHER): Payer: BC Managed Care – PPO

## 2021-06-27 ENCOUNTER — Telehealth: Payer: Self-pay | Admitting: Physician Assistant

## 2021-06-27 ENCOUNTER — Telehealth: Payer: Self-pay

## 2021-06-27 VITALS — BP 122/75 | HR 67 | Temp 98.4°F | Resp 16 | Ht 71.0 in | Wt 211.8 lb

## 2021-06-27 DIAGNOSIS — D509 Iron deficiency anemia, unspecified: Secondary | ICD-10-CM

## 2021-06-27 MED ORDER — SODIUM CHLORIDE 0.9 % IV SOLN
300.0000 mg | Freq: Once | INTRAVENOUS | Status: AC
  Administered 2021-06-27: 300 mg via INTRAVENOUS
  Filled 2021-06-27: qty 15

## 2021-06-27 NOTE — Telephone Encounter (Signed)
-----   Message from Ladene Artist, MD sent at 06/26/2021  1:34 PM EDT ----- Regarding: RE: F/U request Hi, We will contact her to schedule an office appt to consider repeat colonoscopy and EGD for her IDA.  Thank you,  MS  ----- Message ----- From: Cordelia Poche Sent: 06/26/2021   1:30 PM EDT To: Ladene Artist, MD Subject: F/U request                                    Dr. Fuller Plan,  I saw Ms. Sayer today for iron deficiency anemia. She has history of hysterectomy and denies any signs of active bleeding. She underwent colonoscopy with you in 2020 for history of polyps and family history of colon cancer. Would you be able to follow up with the patient to further evaluate underlying cause of iron deficiency? We are planning to proceed with IV iron infusions in the near future.   Appreciate your help.   Murray Hodgkins

## 2021-06-27 NOTE — Telephone Encounter (Signed)
Patient has been rescheduled to 08/07/21 at 9:30 am.

## 2021-06-27 NOTE — Telephone Encounter (Signed)
Left message for patient to call back. Follow up scheduled for 08/01/21 at 9:10 am with Dr. Fuller Plan.

## 2021-06-27 NOTE — Progress Notes (Signed)
Diagnosis: Iron Deficiency Anemia  Provider:  Marshell Garfinkel, MD  Procedure: Infusion  IV Type: Peripheral, IV Location: L Antecubital  Venofer (Iron Sucrose), Dose: 300 mg  Infusion Start Time: 5501  Infusion Stop Time: 5868  Post Infusion IV Care: Observation period completed and Peripheral IV Discontinued  Discharge: Condition: Good, Destination: Home . AVS provided to patient.   Performed by:  Adelina Mings, LPN

## 2021-06-27 NOTE — Telephone Encounter (Signed)
Per 6/14 los called and spoke to pt about appointment.  Pt stated she was starting a new job and her insurance would not work until september so per pt made appointment in September  pt confirmed appointment

## 2021-07-02 ENCOUNTER — Encounter: Payer: Self-pay | Admitting: Physician Assistant

## 2021-07-04 ENCOUNTER — Ambulatory Visit (INDEPENDENT_AMBULATORY_CARE_PROVIDER_SITE_OTHER): Payer: BC Managed Care – PPO

## 2021-07-04 VITALS — BP 120/72 | HR 69 | Temp 98.0°F | Resp 18 | Ht 71.0 in | Wt 211.0 lb

## 2021-07-04 DIAGNOSIS — D509 Iron deficiency anemia, unspecified: Secondary | ICD-10-CM | POA: Diagnosis not present

## 2021-07-04 MED ORDER — SODIUM CHLORIDE 0.9 % IV SOLN
300.0000 mg | Freq: Once | INTRAVENOUS | Status: AC
  Administered 2021-07-04: 300 mg via INTRAVENOUS
  Filled 2021-07-04: qty 15

## 2021-07-04 NOTE — Progress Notes (Cosign Needed)
Diagnosis: Iron Deficiency Anemia  Provider:  Marshell Garfinkel, MD  Procedure: Infusion  IV Type: Peripheral, IV Location: L Antecubital  Venofer (Iron Sucrose), Dose: 300 mg  Infusion Start Time: 7902  Infusion Stop Time: 4097  Post Infusion IV Care: Observation period completed and Peripheral IV Discontinued  Discharge: Condition: Good, Destination: Home . AVS provided to patient.   Performed by:  Beryle Flock, RN

## 2021-07-12 ENCOUNTER — Telehealth: Payer: Self-pay

## 2021-07-12 ENCOUNTER — Ambulatory Visit (INDEPENDENT_AMBULATORY_CARE_PROVIDER_SITE_OTHER): Payer: BC Managed Care – PPO

## 2021-07-12 VITALS — BP 120/69 | HR 60 | Temp 98.4°F | Resp 16 | Ht 71.0 in | Wt 209.2 lb

## 2021-07-12 DIAGNOSIS — D509 Iron deficiency anemia, unspecified: Secondary | ICD-10-CM

## 2021-07-12 MED ORDER — SODIUM CHLORIDE 0.9 % IV SOLN
300.0000 mg | Freq: Once | INTRAVENOUS | Status: AC
  Administered 2021-07-12: 300 mg via INTRAVENOUS
  Filled 2021-07-12: qty 15

## 2021-07-12 NOTE — Telephone Encounter (Signed)
T/C from pt requesting a change in her oral iron Accrufer.  It is $400 with insurance. Her insurance ends today and asked if we could send it in today to [facility].   She is also asking it not be ferrous sulfate because it upsets her stomach.

## 2021-07-12 NOTE — Progress Notes (Signed)
Diagnosis: Iron Deficiency Anemia  Provider:  Marshell Garfinkel, MD  Procedure: Infusion  IV Type: Peripheral, IV Location: L Antecubital  Venofer (Iron Sucrose), Dose: 300 mg  Infusion Start Time: 9977  Infusion Stop Time: 4142  Post Infusion IV Care: Peripheral IV Discontinued  Discharge: Condition: Good, Destination: Home . AVS provided to patient.   Performed by:  Cleophus Molt, RN

## 2021-07-17 NOTE — Telephone Encounter (Signed)
Pt advised of recommendations to hold oral iron for now.  She just finished her last IV iron treatment and due to new insurance not starting until August she is not scheduled to return until September.  Does she continue to hold the oral iron until September?  Please advise

## 2021-07-18 NOTE — Telephone Encounter (Signed)
Called pt to schedule 4 week lab appt but her insurance will not be in effect until 8/17.  A message has been sent to scheduling to arrange lab appt. She is going to try OTC iron until her Sept appt.

## 2021-08-01 ENCOUNTER — Ambulatory Visit: Payer: BC Managed Care – PPO | Admitting: Gastroenterology

## 2021-08-07 ENCOUNTER — Encounter: Payer: Self-pay | Admitting: Gastroenterology

## 2021-08-07 ENCOUNTER — Ambulatory Visit: Payer: BC Managed Care – PPO | Admitting: Gastroenterology

## 2021-08-07 VITALS — BP 120/80 | HR 76 | Ht 71.0 in | Wt 210.0 lb

## 2021-08-07 DIAGNOSIS — K2100 Gastro-esophageal reflux disease with esophagitis, without bleeding: Secondary | ICD-10-CM

## 2021-08-07 DIAGNOSIS — D509 Iron deficiency anemia, unspecified: Secondary | ICD-10-CM

## 2021-08-07 DIAGNOSIS — Z800 Family history of malignant neoplasm of digestive organs: Secondary | ICD-10-CM | POA: Diagnosis not present

## 2021-08-07 MED ORDER — NA SULFATE-K SULFATE-MG SULF 17.5-3.13-1.6 GM/177ML PO SOLN: 1.0000 | Freq: Once | ORAL | 0 refills | Status: AC

## 2021-08-07 NOTE — Progress Notes (Signed)
Assessment     IDA.  Rule out Cameron erosions, esophagitis and other GI sources of blood loss. Dysphagia, R/O stricture GERD with LA Class C esophagitis and 5 cm hiatal hernia  [facility]   Recommendations    Take pantoprazole 40 mg daily (not prn) if reflux symptoms or not adequately controlled consider increasing to twice daily or adding famotidine 40 mg in the evenings.  Closely follow antireflux measures. Schedule colonoscopy and EGD with possible dilation. The risks (including bleeding, perforation, infection, missed lesions, medication reactions and possible hospitalization or surgery if complications occur), benefits, and alternatives to colonoscopy with possible biopsy and possible polypectomy were discussed with the patient and they consent to proceed.  The risks (including bleeding, perforation, infection, missed lesions, medication reactions and possible hospitalization or surgery if complications occur), benefits, and alternatives to endoscopy with possible biopsy and possible dilation were discussed with the patient and they consent to proceed.     HPI    This is a 61 year old female with IDA.  She is followed by hematology and has received 3 iron infusions.  She has ongoing reflux symptoms with intermittent nocturnal reflux and regurgitation and often has a sore throat in the morning.  She takes pantoprazole as needed.  On occasion she notes a slight difficulty swallowing solid foods.  No other gastrointestinal complaints. Denies weight loss, abdominal pain, constipation, diarrhea, change in stool caliber, melena, hematochezia, nausea, vomiting, chest pain.   Colonoscopy Dec 2020 - Three 6 to 7 mm polyps in the sigmoid colon, removed with a cold snare. Resected and retrieved. - The examination was otherwise normal on direct and retroflexion views. Path: hyperplastic  EGD Dec 2016 1. LA Class C esophagitis 2. Stricture at the gastroesophageal junction; dilated using savary  dilator over guidewire 3. 5 cm hiatal hernia   Labs / Imaging       Latest Ref Rng & Units 06/26/2021   10:01 AM  Hepatic Function  Total Protein 6.5 - 8.1 g/dL 7.4   Albumin 3.5 - 5.0 g/dL 3.9   AST 15 - 41 U/L 18   ALT 0 - 44 U/L 14   Alk Phosphatase 38 - 126 U/L 86   Total Bilirubin 0.3 - 1.2 mg/dL 0.6        Latest Ref Rng & Units 06/26/2021   10:01 AM 02/04/2018    9:04 AM 03/31/2012    9:21 PM  CBC  WBC 4.0 - 10.5 K/uL 5.8  8.8  8.3   Hemoglobin 12.0 - 15.0 g/dL 10.4  12.5  13.3   Hematocrit 36.0 - 46.0 % 33.0  39.2  37.9   Platelets 150 - 400 K/uL 313  211  183     Current Medications, Allergies, Past Medical History, Past Surgical History, Family History and Social History were reviewed in [employer] record.   Physical Exam: General: Well developed, well nourished, no acute distress Head: Normocephalic and atraumatic Eyes: Sclerae anicteric, EOMI Ears: Normal auditory acuity Mouth: Not examined Lungs: Clear throughout to auscultation Heart: Regular rate and rhythm; no murmurs, rubs or bruits Abdomen: Soft, non tender and non distended. No masses, hepatosplenomegaly or hernias noted. Normal Bowel sounds Rectal: Deferred to colonoscopy Musculoskeletal: Symmetrical with no gross deformities  Pulses:  Normal pulses noted Extremities: No clubbing, cyanosis, edema or deformities noted Neurological: Alert oriented x 4, grossly nonfocal Psychological:  Alert and cooperative. Normal mood and affect   [place] T. Fuller Plan, MD 08/07/2021, 9:52 AM

## 2021-08-07 NOTE — Patient Instructions (Signed)
Take your pantoprazole 40 mg daily, not as needed.  You have been scheduled for an endoscopy and colonoscopy. Please follow the written instructions given to you at your visit today. Please pick up your prep supplies at the pharmacy within the next 1-3 days. If you use inhalers (even only as needed), please bring them with you on the day of your procedure.  The [facility] providers would like to encourage you to use [facility] to communicate with providers for non-urgent requests or questions.  Due to long hold times on the telephone, sending your provider a message by [facility] may be a faster and more efficient way to get a response.  Please allow 48 business hours for a response.  Please remember that this is for non-urgent requests.   Due to recent changes in healthcare laws, you may see the results of your imaging and laboratory studies on MyChart before your provider has had a chance to review them.  We understand that in some cases there may be results that are confusing or concerning to you. Not all laboratory results come back in the same time frame and the provider may be waiting for multiple results in order to interpret others.  Please give [place] 48 hours in order for your provider to thoroughly review all the results before contacting the office for clarification of your results.   Thank you for choosing me and [facility].  Pricilla Riffle. Dagoberto Ligas., MD., [facility]

## 2021-08-14 ENCOUNTER — Encounter: Payer: Self-pay | Admitting: Gastroenterology

## 2021-08-21 ENCOUNTER — Encounter: Payer: Self-pay | Admitting: Gastroenterology

## 2021-08-21 ENCOUNTER — Ambulatory Visit (AMBULATORY_SURGERY_CENTER): Payer: BC Managed Care – PPO | Admitting: Gastroenterology

## 2021-08-21 VITALS — BP 113/52 | HR 76 | Temp 97.8°F | Resp 10 | Ht 71.0 in | Wt 210.0 lb

## 2021-08-21 DIAGNOSIS — D509 Iron deficiency anemia, unspecified: Secondary | ICD-10-CM

## 2021-08-21 DIAGNOSIS — K449 Diaphragmatic hernia without obstruction or gangrene: Secondary | ICD-10-CM | POA: Diagnosis not present

## 2021-08-21 DIAGNOSIS — K2100 Gastro-esophageal reflux disease with esophagitis, without bleeding: Secondary | ICD-10-CM

## 2021-08-21 DIAGNOSIS — Z800 Family history of malignant neoplasm of digestive organs: Secondary | ICD-10-CM | POA: Diagnosis not present

## 2021-08-21 DIAGNOSIS — K222 Esophageal obstruction: Secondary | ICD-10-CM

## 2021-08-21 DIAGNOSIS — K2980 Duodenitis without bleeding: Secondary | ICD-10-CM

## 2021-08-21 DIAGNOSIS — R1310 Dysphagia, unspecified: Secondary | ICD-10-CM | POA: Diagnosis not present

## 2021-08-21 DIAGNOSIS — K31A0 Gastric intestinal metaplasia, unspecified: Secondary | ICD-10-CM | POA: Diagnosis not present

## 2021-08-21 DIAGNOSIS — D123 Benign neoplasm of transverse colon: Secondary | ICD-10-CM

## 2021-08-21 MED ORDER — SODIUM CHLORIDE 0.9 % IV SOLN: 500.0000 mL | Freq: Once | INTRAVENOUS | Status: DC

## 2021-08-21 MED ORDER — PANTOPRAZOLE SODIUM 40 MG PO TBEC: 40.0000 mg | DELAYED_RELEASE_TABLET | Freq: Two times a day (BID) | ORAL | 12 refills | Status: DC

## 2021-08-21 NOTE — Patient Instructions (Signed)
Clear liquid diet for 2 hours, then advance as tolerated to soft diet at 5:30 PM today. Resume prior diet tomorrow. - Follow antireflux measures long term. - Change pantoprazole to 40 mg po bid, 1 year of refills. - Continue present medications   YOU HAD AN ENDOSCOPIC PROCEDURE TODAY: Refer to the procedure report and other information in the discharge instructions given to you for any specific questions about what was found during the examination. If this information does not answer your questions, please call [place] office at [phone] to clarify.   YOU SHOULD EXPECT: Some feelings of bloating in the abdomen. Passage of more gas than usual. Walking can help get rid of the air that was put into your GI tract during the procedure and reduce the bloating. If you had a lower endoscopy (such as a colonoscopy or flexible sigmoidoscopy) you may notice spotting of blood in your stool or on the toilet paper. Some abdominal soreness may be present for a day or two, also.  DIET: Your first meal following the procedure should be a light meal and then it is ok to progress to your normal diet. A half-sandwich or bowl of soup is an example of a good first meal. Heavy or fried foods are harder to digest and may make you feel nauseous or bloated. Drink plenty of fluids but you should avoid alcoholic beverages for 24 hours. If you had a esophageal dilation, please see attached instructions for diet.    ACTIVITY: Your care partner should take you home directly after the procedure. You should plan to take it easy, moving slowly for the rest of the day. You can resume normal activity the day after the procedure however YOU SHOULD NOT DRIVE, use power tools, machinery or perform tasks that involve climbing or major physical exertion for 24 hours (because of the sedation medicines used during the test).   SYMPTOMS TO REPORT IMMEDIATELY: A gastroenterologist can be reached at any hour. Please call [phone]  for  any of the following symptoms:  Following lower endoscopy (colonoscopy, flexible sigmoidoscopy) Excessive amounts of blood in the stool  Significant tenderness, worsening of abdominal pains  Swelling of the abdomen that is new, acute  Fever of 100 or higher  Following upper endoscopy (EGD, EUS, ERCP, esophageal dilation) Vomiting of blood or coffee ground material  New, significant abdominal pain  New, significant chest pain or pain under the shoulder blades  Painful or persistently difficult swallowing  New shortness of breath  Black, tarry-looking or red, bloody stools  FOLLOW UP:  If any biopsies were taken you will be contacted by phone or by letter within the next 1-3 weeks. Call [phone]  if you have not heard about the biopsies in 3 weeks.  Please also call with any specific questions about appointments or follow up tests.

## 2021-08-21 NOTE — Op Note (Signed)
[facility] Patient Name: [name] Procedure Date: 08/21/2021 2:41 PM MRN: [id number] Endoscopist: Ladene Artist , MD Age: 61 Referring MD:  Date of Birth: 1960/05/31 Gender: Female Account #: [id number] Procedure:                Upper GI endoscopy Indications:              Iron deficiency anemia, Dysphagia, Reflux                            esophagitis Medicines:                Monitored Anesthesia Care Procedure:                Pre-Anesthesia Assessment:                           - Prior to the procedure, a History and Physical                            was performed, and patient medications and                            allergies were reviewed. The patient's tolerance of                            previous anesthesia was also reviewed. The risks                            and benefits of the procedure and the sedation                            options and risks were discussed with the patient.                            All questions were answered, and informed consent                            was obtained. Prior Anticoagulants: The patient has                            taken no previous anticoagulant or antiplatelet                            agents. ASA Grade Assessment: II - A patient with                            mild systemic disease. After reviewing the risks                            and benefits, the patient was deemed in                            satisfactory condition to undergo the procedure.  After obtaining informed consent, the endoscope was                            passed under direct vision. Throughout the                            procedure, the patient's blood pressure, pulse, and                            oxygen saturations were monitored continuously. The                            GIF [id number] was introduced through the                            mouth, and advanced to the second part of duodenum.                             The upper GI endoscopy was accomplished without                            difficulty. The patient tolerated the procedure                            well. Scope In: Scope Out: Findings:                 LA Grade C (one or more mucosal breaks continuous                            between tops of 2 or more mucosal folds, less than                            75% circumference) esophagitis with no bleeding was                            found in the distal esophagus.                           One benign-appearing, intrinsic moderate stenosis                            was found 34 cm from the incisors. This stenosis                            measured 1.2 cm (inner diameter) x less than one cm                            (in length). The stenosis was traversed. A                            guidewire was placed and the scope was withdrawn.  Dilation was performed with a Savary dilator with                            mild resistance at 14 mm.                           The exam of the esophagus was otherwise normal.                           A medium-sized hiatal hernia was present.                           The exam of the stomach was otherwise normal.                           The duodenal bulb and second portion of the                            duodenum were normal. Biopsies for histology were                            taken with a cold forceps for evaluation of celiac                            disease. Complications:            No immediate complications. Estimated Blood Loss:     Estimated blood loss was minimal. Impression:               - LA Grade C reflux esophagitis with no bleeding.                           - Benign-appearing esophageal stenosis. Dilated.                           - Medium-sized hiatal hernia.                           - Normal duodenal bulb and second portion of the                            duodenum.  Biopsied. Recommendation:           - Patient has a contact number available for                            emergencies. The signs and symptoms of potential                            delayed complications were discussed with the                            patient. Return to normal activities tomorrow.                            Written discharge instructions were provided  to the                            patient.                           - Clear liquid diet for 2 hours, then advance as                            tolerated to soft diet today.                           - Resume prior diet tomorrow.                           - Follow antireflux measures long term.                           - Change pantoprazole to 40 mg po bid, 1 year of                            refills.                           - Continue present medications.                           - Await pathology results.                           - IDA likely is secondary to persistent esophagitis.                           - Return to GI office in 6 weeks. Ladene Artist, MD 08/21/2021 3:27:23 PM This report has been signed electronically.

## 2021-08-21 NOTE — Progress Notes (Signed)
Called to room to assist during endoscopic procedure.  Patient ID and intended procedure confirmed with present staff. Received instructions for my participation in the procedure from the performing physician.   Room-verifications, room-access/meds and intra procedure vital signs charted by Gershon Crane, CRNA under this user, while awaiting return of Epic access.

## 2021-08-21 NOTE — Progress Notes (Signed)
To pacu, VSS. Report to [email or web address] Charted by Gershon Crane, CRNA

## 2021-08-21 NOTE — Progress Notes (Signed)
Pt's states no medical or surgical changes since previsit or office visit. 

## 2021-08-21 NOTE — Op Note (Signed)
[facility] Patient Name: [name] Procedure Date: 08/21/2021 2:42 PM MRN: [id number] Endoscopist: Ladene Artist , MD Age: 61 Referring MD:  Date of Birth: [birth year] Gender: Female Account #: [id number] Procedure:                Colonoscopy Indications:              Iron deficiency anemia, Family histor of colon                            cancer, 1st-degree relative Medicines:                Monitored Anesthesia Care Procedure:                Pre-Anesthesia Assessment:                           - Prior to the procedure, a History and Physical                            was performed, and patient medications and                            allergies were reviewed. The patient's tolerance of                            previous anesthesia was also reviewed. The risks                            and benefits of the procedure and the sedation                            options and risks were discussed with the patient.                            All questions were answered, and informed consent                            was obtained. Prior Anticoagulants: The patient has                            taken no previous anticoagulant or antiplatelet                            agents. ASA Grade Assessment: II - A patient with                            mild systemic disease. After reviewing the risks                            and benefits, the patient was deemed in                            satisfactory condition to undergo the procedure.  After obtaining informed consent, the colonoscope                            was passed under direct vision. Throughout the                            procedure, the patient's blood pressure, pulse, and                            oxygen saturations were monitored continuously. The                            Olympus CF-[id number]) Colonoscope was                            introduced through the anus and advanced  to the the                            cecum, identified by appendiceal orifice and                            ileocecal valve. The ileocecal valve, appendiceal                            orifice, and rectum were photographed. The quality                            of the bowel preparation was good. The colonoscopy                            was performed without difficulty. The patient                            tolerated the procedure well. Scope In: 2:50:44 PM Scope Out: 3:07:00 PM Scope Withdrawal Time: 0 hours 12 minutes 25 seconds  Total Procedure Duration: 0 hours 16 minutes 16 seconds  Findings:                 The perianal and digital rectal examinations were                            normal.                           A 6 mm polyp was found in the transverse colon. The                            polyp was sessile. The polyp was removed with a                            cold snare. Resection and retrieval were complete.                           The exam was otherwise without abnormality on  direct and retroflexion views. Complications:            No immediate complications. Estimated blood loss:                            None. Estimated Blood Loss:     Estimated blood loss: none. Impression:               - One 6 mm polyp in the transverse colon, removed                            with a cold snare. Resected and retrieved.                           - The examination was otherwise normal on direct                            and retroflexion views. Recommendation:           - Repeat colonoscopy, likely in 5 years, after                            pathology reviewed for surveillance.                           - Patient has a contact number available for                            emergencies. The signs and symptoms of potential                            delayed complications were discussed with the                            patient. Return to normal  activities tomorrow.                            Written discharge instructions were provided to the                            patient.                           - Resume previous diet.                           - Continue present medications.                           - Await pathology results. Ladene Artist, MD 08/21/2021 3:11:24 PM This report has been signed electronically.

## 2021-08-21 NOTE — Progress Notes (Signed)
See 08/07/2021 H&P, no changes

## 2021-08-22 ENCOUNTER — Telehealth: Payer: Self-pay

## 2021-08-22 NOTE — Telephone Encounter (Signed)
Post procedure follow up call, no answer 

## 2021-08-29 ENCOUNTER — Other Ambulatory Visit: Payer: Self-pay | Admitting: Physician Assistant

## 2021-08-29 DIAGNOSIS — D509 Iron deficiency anemia, unspecified: Secondary | ICD-10-CM

## 2021-08-30 ENCOUNTER — Other Ambulatory Visit: Payer: BC Managed Care – PPO

## 2021-08-30 ENCOUNTER — Inpatient Hospital Stay: Payer: BC Managed Care – PPO

## 2021-08-30 ENCOUNTER — Other Ambulatory Visit: Payer: Self-pay

## 2021-08-30 ENCOUNTER — Inpatient Hospital Stay: Payer: BC Managed Care – PPO | Attending: Physician Assistant | Admitting: Physician Assistant

## 2021-08-30 VITALS — BP 130/71 | HR 61 | Temp 98.1°F | Resp 15 | Wt 213.4 lb

## 2021-08-30 DIAGNOSIS — D509 Iron deficiency anemia, unspecified: Secondary | ICD-10-CM | POA: Diagnosis present

## 2021-08-30 DIAGNOSIS — Z90710 Acquired absence of both cervix and uterus: Secondary | ICD-10-CM | POA: Diagnosis not present

## 2021-08-30 DIAGNOSIS — Z801 Family history of malignant neoplasm of trachea, bronchus and lung: Secondary | ICD-10-CM | POA: Insufficient documentation

## 2021-08-30 DIAGNOSIS — K5900 Constipation, unspecified: Secondary | ICD-10-CM | POA: Insufficient documentation

## 2021-08-30 DIAGNOSIS — Z800 Family history of malignant neoplasm of digestive organs: Secondary | ICD-10-CM | POA: Diagnosis not present

## 2021-08-30 LAB — CBC WITH DIFFERENTIAL (CANCER CENTER ONLY)
Abs Immature Granulocytes: 0.01 10*3/uL (ref 0.00–0.07)
Basophils Absolute: 0 10*3/uL (ref 0.0–0.1)
Basophils Relative: 0 %
Eosinophils Absolute: 0 10*3/uL (ref 0.0–0.5)
Eosinophils Relative: 1 %
HCT: 38.8 % (ref 36.0–46.0)
Hemoglobin: 12.9 g/dL (ref 12.0–15.0)
Immature Granulocytes: 0 %
Lymphocytes Relative: 32 %
Lymphs Abs: 2.1 10*3/uL (ref 0.7–4.0)
MCH: 26.8 pg (ref 26.0–34.0)
MCHC: 33.2 g/dL (ref 30.0–36.0)
MCV: 80.7 fL (ref 80.0–100.0)
Monocytes Absolute: 0.4 10*3/uL (ref 0.1–1.0)
Monocytes Relative: 6 %
Neutro Abs: 4 10*3/uL (ref 1.7–7.7)
Neutrophils Relative %: 61 %
Platelet Count: 215 10*3/uL (ref 150–400)
RBC: 4.81 MIL/uL (ref 3.87–5.11)
RDW: 18.4 % — ABNORMAL HIGH (ref 11.5–15.5)
WBC Count: 6.5 10*3/uL (ref 4.0–10.5)
nRBC: 0 % (ref 0.0–0.2)

## 2021-08-30 LAB — CMP (CANCER CENTER ONLY)
ALT: 16 U/L (ref 0–44)
AST: 17 U/L (ref 15–41)
Albumin: 4 g/dL (ref 3.5–5.0)
Alkaline Phosphatase: 77 U/L (ref 38–126)
Anion gap: 4 — ABNORMAL LOW (ref 5–15)
BUN: 10 mg/dL (ref 6–20)
CO2: 30 mmol/L (ref 22–32)
Calcium: 9 mg/dL (ref 8.9–10.3)
Chloride: 107 mmol/L (ref 98–111)
Creatinine: 0.83 mg/dL (ref 0.44–1.00)
GFR, Estimated: >60 mL/min (ref >60)
Glucose, Bld: 93 mg/dL (ref 70–99)
Potassium: 3.4 mmol/L — ABNORMAL LOW (ref 3.5–5.1)
Sodium: 141 mmol/L (ref 135–145)
Total Bilirubin: 0.6 mg/dL (ref 0.3–1.2)
Total Protein: 7.6 g/dL (ref 6.5–8.1)

## 2021-08-30 LAB — IRON AND IRON BINDING CAPACITY (CC-WL,HP ONLY)
Iron: 99 ug/dL (ref 28–170)
Saturation Ratios: 31 % (ref 10.4–31.8)
TIBC: 323 ug/dL (ref 250–450)
UIBC: 224 ug/dL

## 2021-08-30 LAB — FERRITIN: Ferritin: 48 ng/mL (ref 11–307)

## 2021-08-30 NOTE — Progress Notes (Signed)
[facility] Telephone:([phone]) [phone]   Fax:(336) [phone]  PROGRESS NOTE  Patient Care Team: Tisovec, Fransico Him, MD as PCP - General (Internal Medicine)  Hematological/Oncological History 1)  Labs from Dr. Everlene Farrier, OB/GYN: -06/05/2021: WBC 9.7, Hgb 9.0 (L), MCV 70 (L), Plt 420  2) 06/26/2021: Establish care with [facility]ology  3) 06/27/2021-07/12/2021: Recevied IV Venofer 300 mg once a week x 3 doses  CHIEF COMPLAINTS/PURPOSE OF CONSULTATION:  Iron deficiency anemia  HISTORY OF PRESENTING ILLNESS:  [name] 61 y.o. female returns for follow-up for iron deficiency anemia.  Was last seen in clinic on 06/26/2021 to establish care.  In the interim, she has received 3 cycles of IV Venofer 300 mg.  On exam today, Ms. Tarbell reports that she is feeling significantly better since receiving IV iron.  Her energy levels have improved and she is able to complete her ADLs without any limitations.  Her appetite is good and she denies any weight changes.  She denies any nausea, vomiting or abdominal pain.  She is compliant with taking ferrous sulfate twice a day but reports having constipation since undergoing EGD/colonoscopy on 08/21/2021.  She has not tried any stool softeners or laxatives.  Denies easy bruising or signs of active bleeding.  She no longer craves ice.  Patient denies fevers, chills, night sweats, shortness of breath, chest pain or cough.Rest of the 10 point ROS is below.   MEDICAL HISTORY:  Past Medical History:  Diagnosis Date   Anemia    History, no current problems   Ankle fracture, left 02/18/2018   Closed left ankle fracture 02/04/2018   Colon polyps    Dysrhythmia    abnormal ekg left axis deviation    Esophageal stricture    GERD (gastroesophageal reflux disease)    diet controlled, no meds   History of hiatal hernia    Migraine    last migraine 01/2014   OA (osteoarthritis) of knee    right   Tubal pregnancy 2005   Tubular adenoma of colon  2016    SURGICAL HISTORY: Past Surgical History:  Procedure Laterality Date   ABDOMINAL HYSTERECTOMY  2006   partial, for fibroids & bleeding   COLONOSCOPY  08/2009,2016   stark -polyp   ESOPHAGOGASTRODUODENOSCOPY ENDOSCOPY  08/2009   Fuller Plan esopagus stretched   ORIF ANKLE FRACTURE Left 02/18/2018   Procedure: OPEN REDUCTION INTERNAL FIXATION (ORIF) ANKLE FRACTURE;  Surgeon: Nicholes Stairs, MD;  Location: [facility];  Service: Orthopedics;  Laterality: Left;   plantar fasciatis  2018   left   TUBAL LIGATION  2005   UPPER GASTROINTESTINAL ENDOSCOPY  2016   WISDOM TOOTH EXTRACTION      SOCIAL HISTORY: Social History   Socioeconomic History   Marital status: Married    Spouse name: Tarvio   Number of children: 2   Years of education: Not on file   Highest education level: Not on file  Occupational History   Occupation: Pharmacist, hospital  Tobacco Use   Smoking status: Never   Smokeless tobacco: Never  Vaping Use   Vaping Use: Never used  Substance and Sexual Activity   Alcohol use: No    Alcohol/week: 0.0 standard drinks of alcohol   Drug use: No   Sexual activity: Yes    Birth control/protection: Post-menopausal, Surgical    Comment: Hysterectomy  Other Topics Concern   Not on file  Social History Narrative   Not on file   Social Determinants of Health  Financial Resource Strain: Not on file  Food Insecurity: Not on file  Transportation Needs: Not on file  Physical Activity: Not on file  Stress: Not on file  Social Connections: Not on file  Intimate Partner Violence: Not on file    FAMILY HISTORY: Family History  Problem Relation Age of Onset   Diabetes Father 31       type 2   Cancer Father        carcinoma   Colon cancer Father        late 29's   Other Mother        Vitamin B12 deficiency/DJD-spine   Lung cancer Brother    Cancer Sister        unknown type   Pancreatic cancer Paternal Grandfather    Diabetes Paternal Grandmother     Lung cancer Maternal Grandfather    Esophageal cancer Maternal Grandfather    Cancer Maternal Grandmother        abdominal   Colon polyps Neg Hx    Rectal cancer Neg Hx    Stomach cancer Neg Hx     ALLERGIES:  is allergic to codeine, hydrocodone, oxycodone, and penicillins.  MEDICATIONS:  Current Outpatient Medications  Medication Sig Dispense Refill   ferrous sulfate 325 (65 FE) MG EC tablet Take 325 mg by mouth 3 (three) times daily with meals.     naproxen (NAPROSYN) 500 MG tablet TAKE 1 TABLET BY MOUTH 2 TIMES DAILY WITH MEALS     naproxen sodium (ALEVE) 220 MG tablet Take 440 mg by mouth as needed.     pantoprazole (PROTONIX) 40 MG tablet Take 1 tablet (40 mg total) by mouth 2 (two) times daily. 60 tablet 12   No current facility-administered medications for this visit.    REVIEW OF SYSTEMS:   Constitutional: ( - ) fevers, ( - )  chills , ( - ) night sweats Eyes: ( - ) blurriness of vision, ( - ) double vision, ( - ) watery eyes Ears, nose, mouth, throat, and face: ( - ) mucositis, ( - ) sore throat Respiratory: ( - ) cough, ( + ) dyspnea, ( - ) wheezes Cardiovascular: ( - ) palpitation, ( - ) chest discomfort, ( - ) lower extremity swelling Gastrointestinal:  ( - ) nausea, ( - ) heartburn, ( + ) change in bowel habits Skin: ( - ) abnormal skin rashes Lymphatics: ( - ) new lymphadenopathy, ( - ) easy bruising Neurological: ( - ) numbness, ( - ) tingling, ( - ) new weaknesses Behavioral/Psych: ( - ) mood change, ( - ) new changes  All other systems were reviewed with the patient and are negative.  PHYSICAL EXAMINATION: ECOG PERFORMANCE STATUS: 0 - Asymptomatic  Vitals:   08/30/21 1348  BP: 130/71  Pulse: 61  Resp: 15  Temp: 98.1 F (36.7 C)  SpO2: 100%   Filed Weights   08/30/21 1348  Weight: 213 lb 6.4 oz (96.8 kg)    GENERAL: well appearing female in NAD  SKIN: skin color, texture, turgor are normal, no rashes or significant lesions EYES: conjunctiva are  pink and non-injected, sclera clear OROPHARYNX: no exudate, no erythema; lips, buccal mucosa, and tongue normal  NECK: supple, non-tender LYMPH:  no palpable lymphadenopathy in the cervical or supraclavicular lymph nodes.  LUNGS: clear to auscultation and percussion with normal breathing effort HEART: regular rate & rhythm and no murmurs and no lower extremity edema Musculoskeletal: no cyanosis of digits and no clubbing  PSYCH: alert & oriented x 3, fluent speech NEURO: no focal motor/sensory deficits  LABORATORY DATA:  I have reviewed the data as listed    Latest Ref Rng & Units 08/30/2021    1:32 PM 06/26/2021   10:01 AM 02/04/2018    9:04 AM  CBC  WBC 4.0 - 10.5 K/uL 6.5  5.8  8.8   Hemoglobin 12.0 - 15.0 g/dL 12.9  10.4  12.5   Hematocrit 36.0 - 46.0 % 38.8  33.0  39.2   Platelets 150 - 400 K/uL 215  313  211        Latest Ref Rng & Units 08/30/2021    1:32 PM 06/26/2021   10:01 AM 02/04/2018    9:04 AM  CMP  Glucose 70 - 99 mg/dL 93  99  130   BUN 6 - 20 mg/dL '10  8  6   '$ Creatinine 0.44 - 1.00 mg/dL 0.83  0.75  0.73   Sodium 135 - 145 mmol/L 141  141  140   Potassium 3.5 - 5.1 mmol/L 3.4  3.7  3.6   Chloride 98 - 111 mmol/L 107  106  105   CO2 22 - 32 mmol/L '30  30  28   '$ Calcium 8.9 - 10.3 mg/dL 9.0  9.1  8.8   Total Protein 6.5 - 8.1 g/dL 7.6  7.4    Total Bilirubin 0.3 - 1.2 mg/dL 0.6  0.6    Alkaline Phos 38 - 126 U/L 77  86    AST 15 - 41 U/L 17  18    ALT 0 - 44 U/L 16  14     ASSESSMENT & PLAN [name] is a 61 y.o. female returns for follow-up for iron deficiency anemia.  #Iron deficiency anemia: --Etiology unknown. Patient denies any bleeding episodes.  -- Under the care of of Dr. Fuller Plan, gastroenterologist.  She underwent EGD and colonoscopy on 08/21/2021. Findings included persistent esophagitis which could be causing iron deficiency.  --She is currently taking protonix 40 mg twice daily which will affect iron absorption.  --Received IV venofer 300 mg  once a week x 3 doses from 615/2023-07/12/2021: --Labs today show anemia has resolved with Hgb 12.9, MCV 80.7. Iron panel shows improvement with serum iron 99, TIBC 323, saturation 31%. Ferritin levels pending.  --Currently taking ferrous sulfate 325 mg twice a day. Recommend to decrease to once a day or once every other day to minimize constipation. Advised to take iron pills with source of vitamin C.  --No need for additional IV iron.  --RTC in 3 months with repeat labs   #Constipation: --Secondary to recent endoscopic procedure and PO iron.  --Advised to take take 1/2 bottle of magnesium citrate as it has been 9 days since she had a bowel movement. Then be on maintenance bowel regimen with senna or miralax as needed  No orders of the defined types were placed in this encounter.   All questions were answered. The patient knows to call the clinic with any problems, questions or concerns.  I have spent a total of 25 minutes minutes of face-to-face and non-face-to-face time, preparing to see the patient, performing a medically appropriate examination, counseling and educating the patient, documenting clinical information in the electronic health record,  and care coordination.   Dede Query, PA-C Department of Hematology/Oncology [facility] Phone: [phone]

## 2021-09-03 ENCOUNTER — Telehealth: Payer: Self-pay

## 2021-09-03 NOTE — Telephone Encounter (Signed)
Pt advised with VU 

## 2021-09-03 NOTE — Telephone Encounter (Signed)
-----   Message from Lincoln Brigham, PA-C sent at 09/03/2021 12:20 AM EDT ----- Please notify patient that that iron deficiency has resolved. Recommend to continue iron pills just once a day.

## 2021-09-17 ENCOUNTER — Encounter: Payer: Self-pay | Admitting: Gastroenterology

## 2021-09-27 ENCOUNTER — Ambulatory Visit: Payer: BC Managed Care – PPO | Admitting: Physician Assistant

## 2021-09-27 ENCOUNTER — Other Ambulatory Visit: Payer: BC Managed Care – PPO

## 2021-10-08 ENCOUNTER — Encounter: Payer: Self-pay | Admitting: Gastroenterology

## 2021-10-08 ENCOUNTER — Ambulatory Visit: Payer: BC Managed Care – PPO | Admitting: Gastroenterology

## 2021-10-08 VITALS — BP 130/62 | HR 62 | Ht 71.0 in | Wt 216.4 lb

## 2021-10-08 DIAGNOSIS — K2100 Gastro-esophageal reflux disease with esophagitis, without bleeding: Secondary | ICD-10-CM | POA: Diagnosis not present

## 2021-10-08 DIAGNOSIS — D123 Benign neoplasm of transverse colon: Secondary | ICD-10-CM

## 2021-10-08 DIAGNOSIS — D509 Iron deficiency anemia, unspecified: Secondary | ICD-10-CM

## 2021-10-08 DIAGNOSIS — R1310 Dysphagia, unspecified: Secondary | ICD-10-CM | POA: Diagnosis not present

## 2021-10-08 DIAGNOSIS — Z800 Family history of malignant neoplasm of digestive organs: Secondary | ICD-10-CM | POA: Diagnosis not present

## 2021-10-08 NOTE — Progress Notes (Signed)
10/08/2021 [name] [id number] March 22, 1960   HISTORY OF PRESENT ILLNESS: This is a 61 year old female who is a patient Dr. Silvio Pate.  She is here for follow-up after her EGD and colonoscopy in August.  Was seen here for complaints of dysphagia, iron deficiency anemia, and family history of colon cancer.  Hemoglobin last month was 12.9 g with a normal MCV at 80.7.  Ferritin was 48, serum iron 99, TIBC 323, iron saturation 31%, all levels that are within normal limits.  She follows with the cancer center and has received IV iron infusions.  Is now on once daily PO ferrous sulfate.  Tolerates that well.  Some of her iron deficiency anemia is thought to be due to the persistent esophagitis.  Her pantoprazole was increased to twice daily.  She says that she feels like a new person after having her EGD with dilation and increasing her PPI.  She is doing well without complaints.  Colonoscopy 08/2021 showed one 6 mm polyp that was removed and was a tubular adenoma on pathology.  EGD 08/2021:  - LA Grade C reflux esophagitis with no bleeding. - Benign-appearing esophageal stenosis. Dilated. - Medium-sized hiatal hernia. - Normal duodenal bulb and second portion of the duodenum. Biopsied.  Pathology: REACTIVE DUODENAL MUCOSA WITH FOCAL GASTRIC METAPLASIA COMPATIBLE WITH PEPTIC DUODENITIS  Past Medical History:  Diagnosis Date   Anemia    History, no current problems   Ankle fracture, left 02/18/2018   Closed left ankle fracture 02/04/2018   Colon polyps    Dysrhythmia    abnormal ekg left axis deviation    Esophageal stricture    GERD (gastroesophageal reflux disease)    diet controlled, no meds   History of hiatal hernia    Migraine    last migraine 01/2014   OA (osteoarthritis) of knee    right   Tubal pregnancy 2005   Tubular adenoma of colon 2016   Past Surgical History:  Procedure Laterality Date   ABDOMINAL HYSTERECTOMY  2006   partial, for fibroids & bleeding    COLONOSCOPY  08/2009,2016   stark -polyp   ESOPHAGOGASTRODUODENOSCOPY ENDOSCOPY  08/2009   Fuller Plan esopagus stretched   ORIF ANKLE FRACTURE Left 02/18/2018   Procedure: OPEN REDUCTION INTERNAL FIXATION (ORIF) ANKLE FRACTURE;  Surgeon: Nicholes Stairs, MD;  Location: [place];  Service: Orthopedics;  Laterality: Left;   plantar fasciatis  2018   left   TUBAL LIGATION  2005   UPPER GASTROINTESTINAL ENDOSCOPY  2016   WISDOM TOOTH EXTRACTION      reports that she has never smoked. She has never used smokeless tobacco. She reports that she does not drink alcohol and does not use drugs. family history includes Cancer in her father, maternal grandmother, and sister; Colon cancer in her father; Diabetes in her paternal grandmother; Diabetes (age of onset: 30) in her father; Esophageal cancer in her maternal grandfather; Lung cancer in her brother and maternal grandfather; Other in her mother; Pancreatic cancer in her paternal grandfather. Allergies  Allergen Reactions   Codeine Other (See Comments)    headaches   Hydrocodone Other (See Comments)    Migraine    Oxycodone Nausea And Vomiting and Other (See Comments)    Headache   Penicillins Other (See Comments)    REACTION: headache Did it involve swelling of the face/tongue/throat, SOB, or low BP? No Did it involve sudden or severe rash/hives, skin peeling, or any reaction on the inside of your mouth or  nose? No Did you need to seek medical attention at a hospital or doctor's office? No When did it last happen?      6 years If all above answers are "NO", may proceed with cephalosporin use.  Yeast infection severe      Outpatient Encounter Medications as of 10/08/2021  Medication Sig   ferrous sulfate 325 (65 FE) MG EC tablet Take 325 mg by mouth daily after breakfast.   naproxen (NAPROSYN) 500 MG tablet TAKE 1 TABLET BY MOUTH 2 TIMES DAILY WITH MEALS   naproxen sodium (ALEVE) 220 MG tablet Take 440 mg by mouth as  needed.   pantoprazole (PROTONIX) 40 MG tablet Take 1 tablet (40 mg total) by mouth 2 (two) times daily.   No facility-administered encounter medications on file as of 10/08/2021.     REVIEW OF SYSTEMS  : All other systems reviewed and negative except where noted in the History of Present Illness.   PHYSICAL EXAM: BP 130/62   Pulse 62   Ht '5\' 11"'$  (1.803 m)   Wt 216 lb 6 oz (98.1 kg)   BMI 30.18 kg/m  General: Well developed female in no acute distress Head: Normocephalic and atraumatic Eyes:  Sclerae anicteric, conjunctiva pink. Ears: Normal auditory acuity Lungs: Clear throughout to auscultation; no W/R/R. Heart: Regular rate and rhythm; no M/R/G. Abdomen: Soft, non-distended.  BS present.  Non-tender. Musculoskeletal: Symmetrical with no gross deformities  Skin: No lesions on visible extremities Extremities: No edema  Neurological: Alert oriented x 4, grossly non-focal Psychological:  Alert and cooperative. Normal mood and affect  ASSESSMENT AND PLAN: *GERD and dysphagia: Had grade C esophagitis seen on EGD.  This is thought to possibly be contributing somewhat to her iron deficiency anemia.  Symptoms much better on pantoprazole 40 mg twice daily instead of once daily. *Iron deficiency anemia: Had received IV iron therapy, but now only on oral supplementation once daily.  She will continue that.  Most recent blood counts and iron levels were normal.  **We will continue pantoprazole 40 mg twice daily.  She will follow-up in a year or sooner if needed.   CC:  Tisovec, Fransico Him, MD

## 2021-10-08 NOTE — Patient Instructions (Signed)
_______________________________________________________  If you are age 60 or older, your body mass index should be between 23-30. Your Body mass index is 30.18 kg/m. If this is out of the aforementioned range listed, please consider follow up with your Primary Care Provider.  If you are age 12 or younger, your body mass index should be between 19-25. Your Body mass index is 30.18 kg/m. If this is out of the aformentioned range listed, please consider follow up with your Primary Care Provider.   ________________________________________________________  The [facility] providers would like to encourage you to use [facility] to communicate with providers for non-urgent requests or questions.  Due to long hold times on the telephone, sending your provider a message by [facility] may be a faster and more efficient way to get a response.  Please allow 48 business hours for a response.  Please remember that this is for non-urgent requests.  _______________________________________________________   Follow up in 1 year or sooner if needed.  It was a pleasure to see you today!  Thank you for trusting me with your gastrointestinal care!

## 2021-10-14 ENCOUNTER — Telehealth: Payer: Self-pay | Admitting: Gastroenterology

## 2021-10-14 NOTE — Telephone Encounter (Signed)
Patient requesting refill on Pantoprazole and

## 2021-10-15 ENCOUNTER — Other Ambulatory Visit: Payer: Self-pay

## 2021-10-15 DIAGNOSIS — R1310 Dysphagia, unspecified: Secondary | ICD-10-CM

## 2021-10-15 DIAGNOSIS — K2100 Gastro-esophageal reflux disease with esophagitis, without bleeding: Secondary | ICD-10-CM

## 2021-10-15 MED ORDER — PANTOPRAZOLE SODIUM 40 MG PO TBEC: 40.0000 mg | DELAYED_RELEASE_TABLET | Freq: Two times a day (BID) | ORAL | 12 refills | Status: DC

## 2021-10-17 ENCOUNTER — Other Ambulatory Visit: Payer: Self-pay

## 2021-10-17 DIAGNOSIS — R1310 Dysphagia, unspecified: Secondary | ICD-10-CM

## 2021-10-17 DIAGNOSIS — K2100 Gastro-esophageal reflux disease with esophagitis, without bleeding: Secondary | ICD-10-CM

## 2021-10-17 MED ORDER — FERROUS SULFATE 325 (65 FE) MG PO TBEC: 325.0000 mg | DELAYED_RELEASE_TABLET | Freq: Every day | ORAL | 0 refills | Status: DC

## 2021-10-17 MED ORDER — PANTOPRAZOLE SODIUM 40 MG PO TBEC: 40.0000 mg | DELAYED_RELEASE_TABLET | Freq: Two times a day (BID) | ORAL | 12 refills | Status: DC

## 2021-10-17 NOTE — Telephone Encounter (Signed)
Inbound call from patient to follow up for medication "Pantoprazole".Please advise

## 2021-10-21 NOTE — Telephone Encounter (Signed)
Refill sent to pharmacy on 10/17/21

## 2021-12-06 ENCOUNTER — Ambulatory Visit: Payer: BC Managed Care – PPO | Admitting: Physician Assistant

## 2021-12-06 ENCOUNTER — Other Ambulatory Visit: Payer: BC Managed Care – PPO

## 2022-05-27 ENCOUNTER — Telehealth: Payer: Self-pay | Admitting: Gastroenterology

## 2022-05-27 DIAGNOSIS — K2100 Gastro-esophageal reflux disease with esophagitis, without bleeding: Secondary | ICD-10-CM

## 2022-05-27 DIAGNOSIS — R1310 Dysphagia, unspecified: Secondary | ICD-10-CM

## 2022-05-27 NOTE — Telephone Encounter (Signed)
Patient called to request a refill on the Protonix medication.

## 2022-05-28 MED ORDER — PANTOPRAZOLE SODIUM 40 MG PO TBEC: 40.0000 mg | DELAYED_RELEASE_TABLET | Freq: Two times a day (BID) | ORAL | 5 refills | Status: DC

## 2022-05-28 NOTE — Telephone Encounter (Signed)
Refill sent to pharmacy.   

## 2022-07-24 ENCOUNTER — Other Ambulatory Visit: Payer: Self-pay

## 2022-07-24 ENCOUNTER — Encounter (HOSPITAL_BASED_OUTPATIENT_CLINIC_OR_DEPARTMENT_OTHER): Payer: Self-pay | Admitting: Orthopedic Surgery

## 2022-07-29 NOTE — Progress Notes (Signed)

## 2022-07-30 ENCOUNTER — Encounter (HOSPITAL_BASED_OUTPATIENT_CLINIC_OR_DEPARTMENT_OTHER): Payer: Self-pay | Admitting: Anesthesiology

## 2022-07-31 NOTE — Anesthesia Preprocedure Evaluation (Deleted)
Anesthesia Evaluation    Reviewed: Allergy & Precautions, Patient's Chart, lab work & pertinent test results  Airway        Dental   Pulmonary           Cardiovascular + dysrhythmias      Neuro/Psych  Headaches  negative psych ROS   GI/Hepatic Neg liver ROS, hiatal hernia,GERD  ,,  Endo/Other    Renal/GU negative Renal ROS     Musculoskeletal  (+) Arthritis ,    Abdominal   Peds  Hematology   Anesthesia Other Findings All: Codeine, hydrocodone, oxycodone, PCN  Reproductive/Obstetrics                             Anesthesia Physical Anesthesia Plan  ASA: 2  Anesthesia Plan: MAC   Post-op Pain Management: Regional block*, Minimal or no pain anticipated, Precedex and Ofirmev IV (intra-op)*   Induction: Intravenous  PONV Risk Score and Plan: 2 and Treatment may vary due to age or medical condition, Midazolam, Ondansetron and Propofol infusion  Airway Management Planned: Nasal Cannula and Natural Airway  Additional Equipment: None  Intra-op Plan:   Post-operative Plan:   Informed Consent:      Dental advisory given  Plan Discussed with: CRNA  Anesthesia Plan Comments: (L Popliteal, L adductor)        Anesthesia Quick Evaluation

## 2022-08-22 ENCOUNTER — Other Ambulatory Visit: Payer: Self-pay

## 2022-08-22 ENCOUNTER — Encounter (HOSPITAL_BASED_OUTPATIENT_CLINIC_OR_DEPARTMENT_OTHER): Payer: Self-pay | Admitting: Orthopedic Surgery

## 2022-08-29 ENCOUNTER — Encounter (HOSPITAL_BASED_OUTPATIENT_CLINIC_OR_DEPARTMENT_OTHER)
Admission: RE | Disposition: A | Discharge status: Home / Self Care | Payer: Self-pay | Source: Home / Self Care | Attending: Orthopedic Surgery

## 2022-08-29 ENCOUNTER — Encounter (HOSPITAL_BASED_OUTPATIENT_CLINIC_OR_DEPARTMENT_OTHER): Payer: Self-pay | Admitting: Certified Registered Nurse Anesthetist

## 2022-08-29 ENCOUNTER — Encounter (HOSPITAL_BASED_OUTPATIENT_CLINIC_OR_DEPARTMENT_OTHER): Payer: Self-pay | Admitting: Orthopedic Surgery

## 2022-08-29 ENCOUNTER — Ambulatory Visit (HOSPITAL_BASED_OUTPATIENT_CLINIC_OR_DEPARTMENT_OTHER): Payer: Self-pay | Admitting: Certified Registered Nurse Anesthetist

## 2022-08-29 ENCOUNTER — Ambulatory Visit (HOSPITAL_BASED_OUTPATIENT_CLINIC_OR_DEPARTMENT_OTHER)
Admission: RE | Admit: 2022-08-29 | Discharge: 2022-08-29 | Disposition: A | Discharge status: Home / Self Care | Payer: BC Managed Care – PPO | Source: Home / Self Care | Attending: Orthopedic Surgery | Admitting: Orthopedic Surgery

## 2022-08-29 ENCOUNTER — Ambulatory Visit (HOSPITAL_BASED_OUTPATIENT_CLINIC_OR_DEPARTMENT_OTHER): Payer: Self-pay

## 2022-08-29 DIAGNOSIS — K219 Gastro-esophageal reflux disease without esophagitis: Secondary | ICD-10-CM | POA: Diagnosis not present

## 2022-08-29 DIAGNOSIS — M1711 Unilateral primary osteoarthritis, right knee: Secondary | ICD-10-CM | POA: Insufficient documentation

## 2022-08-29 DIAGNOSIS — Y798 Miscellaneous orthopedic devices associated with adverse incidents, not elsewhere classified: Secondary | ICD-10-CM | POA: Insufficient documentation

## 2022-08-29 DIAGNOSIS — T8484XA Pain due to internal orthopedic prosthetic devices, implants and grafts, initial encounter: Secondary | ICD-10-CM | POA: Diagnosis present

## 2022-08-29 HISTORY — PX: HARDWARE REMOVAL: SHX979

## 2022-08-29 SURGERY — REMOVAL, HARDWARE
Anesthesia: General | Site: Ankle | Laterality: Left

## 2022-08-29 MED ORDER — LIDOCAINE 2% (20 MG/ML) 5 ML SYRINGE
INTRAMUSCULAR | Status: AC
  Filled 2022-08-29: qty 5

## 2022-08-29 MED ORDER — ACETAMINOPHEN 10 MG/ML IV SOLN: 1000.0000 mg | Freq: Once | INTRAVENOUS | Status: DC | PRN

## 2022-08-29 MED ORDER — EPHEDRINE 5 MG/ML INJ
INTRAVENOUS | Status: AC
  Filled 2022-08-29: qty 5

## 2022-08-29 MED ORDER — FENTANYL CITRATE (PF) 100 MCG/2ML IJ SOLN: 25.0000 ug | INTRAMUSCULAR | Status: DC | PRN

## 2022-08-29 MED ORDER — LACTATED RINGERS IV SOLN: INTRAVENOUS | Status: DC

## 2022-08-29 MED ORDER — ROCURONIUM BROMIDE 10 MG/ML (PF) SYRINGE
PREFILLED_SYRINGE | INTRAVENOUS | Status: AC
  Filled 2022-08-29: qty 10

## 2022-08-29 MED ORDER — MIDAZOLAM HCL 2 MG/2ML IJ SOLN
INTRAMUSCULAR | Status: AC
  Filled 2022-08-29: qty 2

## 2022-08-29 MED ORDER — PROPOFOL 10 MG/ML IV BOLUS
INTRAVENOUS | Status: AC
  Filled 2022-08-29: qty 20

## 2022-08-29 MED ORDER — ONDANSETRON HCL 4 MG/2ML IJ SOLN: 4.0000 mg | Freq: Once | INTRAMUSCULAR | Status: DC | PRN

## 2022-08-29 MED ORDER — FENTANYL CITRATE (PF) 100 MCG/2ML IJ SOLN
INTRAMUSCULAR | Status: AC
  Filled 2022-08-29: qty 2

## 2022-08-29 MED ORDER — BUPIVACAINE HCL (PF) 0.25 % IJ SOLN
INTRAMUSCULAR | Status: AC
  Filled 2022-08-29: qty 30

## 2022-08-29 MED ORDER — MIDAZOLAM HCL 2 MG/2ML IJ SOLN
INTRAMUSCULAR | Status: DC | PRN
  Administered 2022-08-29: 2 mg via INTRAVENOUS

## 2022-08-29 MED ORDER — DEXAMETHASONE SODIUM PHOSPHATE 10 MG/ML IJ SOLN
INTRAMUSCULAR | Status: DC | PRN
  Administered 2022-08-29: 5 mg via INTRAVENOUS

## 2022-08-29 MED ORDER — BUPIVACAINE HCL (PF) 0.5 % IJ SOLN
INTRAMUSCULAR | Status: DC | PRN
  Administered 2022-08-29: 30 mL

## 2022-08-29 MED ORDER — BUPIVACAINE HCL (PF) 0.5 % IJ SOLN
INTRAMUSCULAR | Status: AC
  Filled 2022-08-29: qty 30

## 2022-08-29 MED ORDER — 0.9 % SODIUM CHLORIDE (POUR BTL) OPTIME
TOPICAL | Status: DC | PRN
Start: 2022-08-29 — End: 2022-08-29
  Administered 2022-08-29: 500 mL

## 2022-08-29 MED ORDER — CEFAZOLIN SODIUM-DEXTROSE 2-4 GM/100ML-% IV SOLN: 2.0000 g | INTRAVENOUS | Status: DC

## 2022-08-29 MED ORDER — VANCOMYCIN HCL 500 MG IV SOLR
INTRAVENOUS | Status: AC
  Filled 2022-08-29: qty 20

## 2022-08-29 MED ORDER — ACETAMINOPHEN 500 MG PO TABS
1000.0000 mg | ORAL_TABLET | Freq: Once | ORAL | Status: AC
  Administered 2022-08-29: 1000 mg via ORAL

## 2022-08-29 MED ORDER — PROPOFOL 10 MG/ML IV BOLUS
INTRAVENOUS | Status: DC | PRN
  Administered 2022-08-29: 200 ug via INTRAVENOUS

## 2022-08-29 MED ORDER — LIDOCAINE 2% (20 MG/ML) 5 ML SYRINGE
INTRAMUSCULAR | Status: DC | PRN
  Administered 2022-08-29: 100 mg via INTRAVENOUS

## 2022-08-29 MED ORDER — EPHEDRINE SULFATE-NACL 50-0.9 MG/10ML-% IV SOSY
PREFILLED_SYRINGE | INTRAVENOUS | Status: DC | PRN
  Administered 2022-08-29: 10 mg via INTRAVENOUS
  Administered 2022-08-29: 5 mg via INTRAVENOUS

## 2022-08-29 MED ORDER — ONDANSETRON HCL 4 MG/2ML IJ SOLN
INTRAMUSCULAR | Status: AC
  Filled 2022-08-29: qty 2

## 2022-08-29 MED ORDER — ONDANSETRON HCL 4 MG/2ML IJ SOLN
INTRAMUSCULAR | Status: DC | PRN
Start: 2022-08-29 — End: 2022-08-29
  Administered 2022-08-29: 4 mg via INTRAVENOUS

## 2022-08-29 MED ORDER — ACETAMINOPHEN 500 MG PO TABS
ORAL_TABLET | ORAL | Status: AC
  Filled 2022-08-29: qty 2

## 2022-08-29 MED ORDER — ONDANSETRON HCL 4 MG PO TABS: 4.0000 mg | ORAL_TABLET | Freq: Three times a day (TID) | ORAL | 0 refills | Status: AC | PRN

## 2022-08-29 MED ORDER — CEFAZOLIN SODIUM-DEXTROSE 2-3 GM-%(50ML) IV SOLR
INTRAVENOUS | Status: DC | PRN
  Administered 2022-08-29: 2 g via INTRAVENOUS

## 2022-08-29 MED ORDER — HYDROCODONE-ACETAMINOPHEN 7.5-325 MG PO TABS: 1.0000 | ORAL_TABLET | Freq: Four times a day (QID) | ORAL | 0 refills | Status: AC | PRN

## 2022-08-29 MED ORDER — KETOROLAC TROMETHAMINE 30 MG/ML IJ SOLN
INTRAMUSCULAR | Status: AC
  Filled 2022-08-29: qty 1

## 2022-08-29 MED ORDER — KETOROLAC TROMETHAMINE 30 MG/ML IJ SOLN
INTRAMUSCULAR | Status: DC | PRN
  Administered 2022-08-29: 30 mg via INTRAVENOUS

## 2022-08-29 MED ORDER — FENTANYL CITRATE (PF) 100 MCG/2ML IJ SOLN
INTRAMUSCULAR | Status: DC | PRN
  Administered 2022-08-29: 50 ug via INTRAVENOUS

## 2022-08-29 SURGICAL SUPPLY — 51 items
APL PRP STRL LF DISP 70% ISPRP (MISCELLANEOUS) ×1
BANDAGE ESMARK 6X9 LF (GAUZE/BANDAGES/DRESSINGS) ×2 IMPLANT
BLADE SURG 15 STRL LF DISP TIS (BLADE) ×1 IMPLANT
BLADE SURG 15 STRL SS (BLADE) ×2
BNDG CMPR 5X4 CHSV STRCH STRL (GAUZE/BANDAGES/DRESSINGS)
BNDG CMPR 5X4 KNIT ELC UNQ LF (GAUZE/BANDAGES/DRESSINGS)
BNDG CMPR 6 X 5 YARDS HK CLSR (GAUZE/BANDAGES/DRESSINGS) ×1
BNDG CMPR 9X6 STRL LF SNTH (GAUZE/BANDAGES/DRESSINGS) ×1
BNDG COHESIVE 4X5 TAN STRL LF (GAUZE/BANDAGES/DRESSINGS) IMPLANT
BNDG ELASTIC 4INX 5YD STR LF (GAUZE/BANDAGES/DRESSINGS) IMPLANT
BNDG ELASTIC 6INX 5YD STR LF (GAUZE/BANDAGES/DRESSINGS) ×1 IMPLANT
BNDG ESMARK 6X9 LF (GAUZE/BANDAGES/DRESSINGS) ×1
CHLORAPREP W/TINT 26 (MISCELLANEOUS) ×1 IMPLANT
CLSR STERI-STRIP ANTIMIC 1/2X4 (GAUZE/BANDAGES/DRESSINGS) ×1 IMPLANT
DRAPE EXTREMITY T 121X128X90 (DISPOSABLE) ×1 IMPLANT
DRAPE IMP U-DRAPE 54X76 (DRAPES) ×1 IMPLANT
DRAPE INCISE IOBAN 66X45 STRL (DRAPES) IMPLANT
DRAPE OEC MINIVIEW 54X84 (DRAPES) IMPLANT
DRAPE U-SHAPE 47X51 STRL (DRAPES) ×1 IMPLANT
DRSG AQUACEL AG ADV 3.5X10 (GAUZE/BANDAGES/DRESSINGS) IMPLANT
ELECT REM PT RETURN 9FT ADLT (ELECTROSURGICAL) ×1
ELECTRODE REM PT RTRN 9FT ADLT (ELECTROSURGICAL) ×2 IMPLANT
GAUZE PAD ABD 8X10 STRL (GAUZE/BANDAGES/DRESSINGS) IMPLANT
GAUZE SPONGE 4X4 12PLY STRL (GAUZE/BANDAGES/DRESSINGS) ×1 IMPLANT
GAUZE XEROFORM 1X8 LF (GAUZE/BANDAGES/DRESSINGS) IMPLANT
GLOVE BIO SURGEON STRL SZ7.5 (GLOVE) ×2 IMPLANT
GLOVE BIOGEL PI IND STRL 8 (GLOVE) ×4 IMPLANT
GOWN STRL REUS W/ TWL LRG LVL3 (GOWN DISPOSABLE) ×2 IMPLANT
GOWN STRL REUS W/TWL LRG LVL3 (GOWN DISPOSABLE) ×1
GOWN STRL REUS W/TWL XL LVL3 (GOWN DISPOSABLE) ×4 IMPLANT
NS IRRIG 1000ML POUR BTL (IV SOLUTION) ×1 IMPLANT
PACK ARTHROSCOPY DSU (CUSTOM PROCEDURE TRAY) ×1 IMPLANT
PACK BASIN DAY SURGERY FS (CUSTOM PROCEDURE TRAY) ×2 IMPLANT
PAD CAST 4YDX4 CTTN HI CHSV (CAST SUPPLIES) ×1 IMPLANT
PADDING CAST ABS COTTON 6X4 NS (CAST SUPPLIES) IMPLANT
PADDING CAST COTTON 4X4 STRL (CAST SUPPLIES) ×1
PENCIL SMOKE EVACUATOR (MISCELLANEOUS) ×2 IMPLANT
SLEEVE SCD COMPRESS KNEE MED (STOCKING) IMPLANT
SPIKE FLUID TRANSFER (MISCELLANEOUS) IMPLANT
SPONGE T-LAP 18X18 ~~LOC~~+RFID (SPONGE) ×1 IMPLANT
STOCKINETTE TUBULAR 6 INCH (GAUZE/BANDAGES/DRESSINGS) IMPLANT
SUT ETHILON 3 0 PS 1 (SUTURE) IMPLANT
SUT MNCRL AB 4-0 PS2 18 (SUTURE) IMPLANT
SUT VIC AB 0 CT1 27 (SUTURE)
SUT VIC AB 0 CT1 27XBRD ANBCTR (SUTURE) ×1 IMPLANT
SUT VIC AB 2-0 CT1 27 (SUTURE) ×2
SUT VIC AB 2-0 CT1 TAPERPNT 27 (SUTURE) ×1 IMPLANT
SUT VIC AB 3-0 SH 27 (SUTURE)
SUT VIC AB 3-0 SH 27X BRD (SUTURE) IMPLANT
SYR BULB EAR ULCER 3OZ GRN STR (SYRINGE) ×1 IMPLANT
TOWEL GREEN STERILE FF (TOWEL DISPOSABLE) ×1 IMPLANT

## 2022-08-29 NOTE — Transfer of Care (Signed)
Immediate Anesthesia Transfer of Care Note  Patient: [name]  Procedure(s) Performed: ankle medial and lateral hardware removal (Left: Ankle)  Patient Location: PACU  Anesthesia Type:General  Level of Consciousness: drowsy and patient cooperative  Airway & Oxygen Therapy: Patient Spontanous Breathing and Patient connected to face mask oxygen  Post-op Assessment: Report given to RN and Post -op Vital signs reviewed and stable  Post vital signs: Reviewed and stable  Last Vitals:  Vitals Value Taken Time  BP 125/76 08/29/22 1321  Temp    Pulse 77 08/29/22 1323  Resp 12 08/29/22 1323  SpO2 95 % 08/29/22 1323  Vitals shown include unfiled device data.  Last Pain:  Vitals:   08/29/22 1124  TempSrc: Temporal  PainSc: 0-No pain         Complications: No notable events documented.

## 2022-08-29 NOTE — Anesthesia Preprocedure Evaluation (Addendum)
Anesthesia Evaluation  Patient identified by MRN, date of birth, ID band Patient awake    Reviewed: Allergy & Precautions, H&P , NPO status , Patient's Chart, lab work & pertinent test results  Airway Mallampati: II  TM Distance: >3 FB Neck ROM: Full    Dental no notable dental hx.    Pulmonary neg pulmonary ROS   Pulmonary exam normal breath sounds clear to auscultation       Cardiovascular negative cardio ROS Normal cardiovascular exam Rhythm:Regular Rate:Normal     Neuro/Psych negative neurological ROS  negative psych ROS   GI/Hepatic Neg liver ROS,GERD  ,,  Endo/Other  negative endocrine ROS    Renal/GU negative Renal ROS  negative genitourinary   Musculoskeletal negative musculoskeletal ROS (+)    Abdominal   Peds negative pediatric ROS (+)  Hematology negative hematology ROS (+)   Anesthesia Other Findings   Reproductive/Obstetrics negative OB ROS                             Anesthesia Physical Anesthesia Plan  ASA: 2  Anesthesia Plan: General   Post-op Pain Management: Toradol IV (intra-op)*   Induction: Intravenous  PONV Risk Score and Plan: 3 and Ondansetron and Dexamethasone  Airway Management Planned: LMA  Additional Equipment:   Intra-op Plan:   Post-operative Plan: Extubation in OR  Informed Consent: I have reviewed the patients History and Physical, chart, labs and discussed the procedure including the risks, benefits and alternatives for the proposed anesthesia with the patient or authorized representative who has indicated his/her understanding and acceptance.     Dental advisory given  Plan Discussed with: CRNA and Surgeon  Anesthesia Plan Comments:        Anesthesia Quick Evaluation

## 2022-08-29 NOTE — Anesthesia Postprocedure Evaluation (Signed)
Anesthesia Post Note  Patient: [name]  Procedure(s) Performed: ankle medial and lateral hardware removal (Left: Ankle)     Patient location during evaluation: PACU Anesthesia Type: General Level of consciousness: awake and alert Pain management: pain level controlled Vital Signs Assessment: post-procedure vital signs reviewed and stable Respiratory status: spontaneous breathing, nonlabored ventilation, respiratory function stable and patient connected to nasal cannula oxygen Cardiovascular status: blood pressure returned to baseline and stable Postop Assessment: no apparent nausea or vomiting Anesthetic complications: no  No notable events documented.  Last Vitals:  Vitals:   08/29/22 1400 08/29/22 1424  BP: 128/73   Pulse: 67 66  Resp: 10   Temp:  36.6 C  SpO2: 96% 100%    Last Pain:  Vitals:   08/29/22 1424  TempSrc: Oral  PainSc: 0-No pain                 Kalik Hoare S

## 2022-08-29 NOTE — Op Note (Signed)
Date of Surgery: 08/29/2022  INDICATIONS: Ms. Okuma is a 62 y.o.-year-old female with a left painful retained orthopedic hardware left ankle;  The patient did consent to the procedure after discussion of the risks and benefits.  PREOPERATIVE DIAGNOSIS:  1.  Left ankle painful retained orthopedic hardware  POSTOPERATIVE DIAGNOSIS: Same.  PROCEDURE:  1.  Removal of deep/buried orthopedic implant lateral malleolus left ankle 2.  Through a separate incision removal of deep/buried orthopedic implant medial malleolus of the left ankle  SURGEON: Maryan Rued, M.D.  ASSIST: Dion Saucier, PA-C  Assistant attestation:  PA McClung present for the entire procedure..  ANESTHESIA:  general, local infiltrated at the wounds with 30 cc of 1/2% Marcaine plain.  IV FLUIDS AND URINE: See anesthesia.  ESTIMATED BLOOD LOSS: 10 mL.  IMPLANTS: None  Tourniquet:  Left thigh tourniquet at 275 mmHg x 45 minutes.  Explants:  Cannulated screw x 2 for medial malleolus 5 screws as well as one third tubular plate from lateral malleolus  DRAINS: None  COMPLICATIONS: None.  DESCRIPTION OF PROCEDURE: The patient was brought to the operating room and placed supine on the operating table.  The patient had been signed prior to the procedure and this was documented. The patient had the anesthesia placed by the anesthesiologist.  A time-out was performed to confirm that this was the correct patient, site, side and location. The patient did receive antibiotics prior to the incision and was re-dosed during the procedure as needed at indicated intervals.  A tourniquet was placed.  The patient had the operative extremity prepped and draped in the standard surgical fashion.      We began the procedure by utilizing the lateral incision along the distal fibula.  We sharply entered this previously utilized incision and developed anterior posterior skin flaps.  We then dissected down with Bovie electrocautery to the  plate.  We identified the plate and then used our freer to liberate each screw as well as soft tissue that had envelope to the empty screw holes in the plate.  Once we had access to all 5 screws we then used the hexagonal screwdriver to remove the screws from the plate.  We then utilized our freer to elevate the plate off of the bone.  We then used a curette and a rongeurs to remove and clear any debris from the screw holes.  This was then copiously irrigated.  Next, we turned our attention to the medial side.  Again utilizing the previously used approach to the medial malleolus we sharply cut down onto the periosteum and developed anterior posterior skin flaps.  There was some hypertrophic scar tissue along the screw heads which was removed with electrocautery and rondure.  We then accessed the screw heads with the hexagonal screwdriver.  We removed to 4.0 mm cannulated screws partially-threaded.  All hardware was passed off the back table.  We then copiously irrigated the medial wounds.  With medial and lateral wounds were closed in a similar fashion by first infiltrating with quarter percent plain Marcaine 30 cc total.  2-0 Vicryl for the dermal layer and interrupted horizontal mattress 3-0 nylon for skin.  The leg was cleaned and dried in standard sterile bandage with a soft wrap was placed on the ankle.  All counts were correct x 2.  Patient was awoken from general anesthetic in stable condition and transported back to the stretcher and to the PACU.  No noted complications.  POSTOPERATIVE PLAN:  Ms. Lazier will be discharged later  this afternoon from PACU.  She will be appropriate for immediate weightbearing as tolerated to the left lower extremity.  She will need to elevate and ice according to her pain and swelling.  She will be on aspirin for 6 weeks for DVT prophylaxis.  I will see her back in the office in 2 weeks with 3 x-ray views of the left ankle.

## 2022-08-29 NOTE — Brief Op Note (Signed)
08/29/2022  1:08 PM  PATIENT:  Myrtie Soman  62 y.o. female  PRE-OPERATIVE DIAGNOSIS:  Left ankle painful hardware  POST-OPERATIVE DIAGNOSIS:  Left ankle painful hardware  PROCEDURE:  Procedure(s) with comments: ankle medial and lateral hardware removal (Left) - 61  SURGEON:  Surgeons and Role:    Yolonda Kida, MD - Primary  PHYSICIAN ASSISTANT: Dion Saucier, PA-C   ANESTHESIA:   local and general  EBL:  30 mL   BLOOD ADMINISTERED:none  DRAINS: none   LOCAL MEDICATIONS USED:  MARCAINE     SPECIMEN:  No Specimen  DISPOSITION OF SPECIMEN:  N/A  COUNTS:  YES  TOURNIQUET:  * Missing tourniquet times found for documented tourniquets in log: [id number] *  DICTATION: .Note written in EPIC  PLAN OF CARE: Discharge to home after PACU  PATIENT DISPOSITION:  PACU - hemodynamically stable.   Delay start of Pharmacological VTE agent (>24hrs) due to surgical blood loss or risk of bleeding: not applicable

## 2022-08-29 NOTE — Discharge Instructions (Addendum)
Orthopedic surgery discharge instructions  -Maintain soft bandage on your left ankle for 3 days.  You may remove this on Monday and begin showering at that time.  Please do not submerge underwater.  -You may weight-bear as tolerated immediately following surgery.  -You should apply ice liberally to the left ankle throughout the day for 30 minutes each session.  Should also elevate with your "toes above nose."  As often as possible throughout the day to reduce swelling and pain.  -For mild to moderate pain use Tylenol and Advil around-the-clock.  -For any breakthrough pain use hydrocodone as necessary.  -For the prevention of blood clots in your legs you should take an 81 mg aspirin once per day x 6 weeks.   Post Anesthesia Home Care Instructions  Activity: Get plenty of rest for the remainder of the day. A responsible individual must stay with you for 24 hours following the procedure.  For the next 24 hours, DO NOT: -Drive a car -Advertising copywriter -Drink alcoholic beverages -Take any medication unless instructed by your physician -Make any legal decisions or sign important papers.  Meals: Start with liquid foods such as gelatin or soup. Progress to regular foods as tolerated. Avoid greasy, spicy, heavy foods. If nausea and/or vomiting occur, drink only clear liquids until the nausea and/or vomiting subsides. Call your physician if vomiting continues.  Special Instructions/Symptoms: Your throat may feel dry or sore from the anesthesia or the breathing tube placed in your throat during surgery. If this causes discomfort, gargle with warm salt water. The discomfort should disappear within 24 hours.  Can have ibuprofen after 9pm if needed Can have tylenol after 6pm if needed

## 2022-08-29 NOTE — H&P (Signed)
ORTHOPAEDIC CONSULTATION  REQUESTING PHYSICIAN: Yolonda Kida, MD  PCP:  Gaspar Garbe, MD  Chief Complaint: Left ankle hardware pain  HPI: [name] is a 62 y.o. female who complains of pain and stiffness from retained orthopedic hardware in the left ankle.  Here today for removal of lateral and medial hardware.  No new complaints.  Past Medical History:  Diagnosis Date   Anemia    History, no current problems   Ankle fracture, left 02/18/2018   Closed left ankle fracture 02/04/2018   Colon polyps    Dysrhythmia    abnormal ekg left axis deviation    Esophageal stricture    GERD (gastroesophageal reflux disease)    diet controlled, no meds   History of hiatal hernia    Migraine    last migraine 01/2014   OA (osteoarthritis) of knee    right   Tubal pregnancy 2005   Tubular adenoma of colon 2016   Past Surgical History:  Procedure Laterality Date   ABDOMINAL HYSTERECTOMY  2006   partial, for fibroids & bleeding   COLONOSCOPY  08/2009,2016   stark -polyp   ESOPHAGOGASTRODUODENOSCOPY ENDOSCOPY  08/2009   Russella Dar esopagus stretched   ORIF ANKLE FRACTURE Left 02/18/2018   Procedure: OPEN REDUCTION INTERNAL FIXATION (ORIF) ANKLE FRACTURE;  Surgeon: Yolonda Kida, MD;  Location: [facility] [place];  Service: Orthopedics;  Laterality: Left;   plantar fasciatis  2018   left   TUBAL LIGATION  2005   UPPER GASTROINTESTINAL ENDOSCOPY  2016   WISDOM TOOTH EXTRACTION     Social History   Socioeconomic History   Marital status: Married    Spouse name: Tarvio   Number of children: 2   Years of education: Not on file   Highest education level: Not on file  Occupational History   Occupation: Runner, broadcasting/film/video  Tobacco Use   Smoking status: Never   Smokeless tobacco: Never  Vaping Use   Vaping status: Never Used  Substance and Sexual Activity   Alcohol use: No    Alcohol/week: 0.0 standard drinks of alcohol   Drug use: No   Sexual activity: Yes     Birth control/protection: Post-menopausal, Surgical    Comment: Hysterectomy  Other Topics Concern   Not on file  Social History Narrative   Not on file   Social Determinants of Health   Financial Resource Strain: Not on file  Food Insecurity: Not on file  Transportation Needs: Not on file  Physical Activity: Not on file  Stress: Not on file  Social Connections: Unknown (05/24/2021)   [facility]   Social Network    Social Network: Not on file   Family History  Problem Relation Age of Onset   Diabetes Father 6       type 2   Cancer Father        carcinoma   Colon cancer Father        late 62's   Other Mother        Vitamin B12 deficiency/DJD-spine   Lung cancer Brother    Cancer Sister        unknown type   Pancreatic cancer Paternal Grandfather    Diabetes Paternal Grandmother    Lung cancer Maternal Grandfather    Esophageal cancer Maternal Grandfather    Cancer Maternal Grandmother        abdominal   Colon polyps Neg Hx    Rectal cancer Neg Hx  Stomach cancer Neg Hx    Allergies  Allergen Reactions   Codeine Other (See Comments)    headaches   Hydrocodone Other (See Comments)    Migraine    Oxycodone Nausea And Vomiting and Other (See Comments)    Headache   Penicillins Other (See Comments)    REACTION: headache Did it involve swelling of the face/tongue/throat, SOB, or low BP? No Did it involve sudden or severe rash/hives, skin peeling, or any reaction on the inside of your mouth or nose? No Did you need to seek medical attention at a hospital or doctor's office? No When did it last happen?      6 years If all above answers are "NO", may proceed with cephalosporin use.  Yeast infection severe   Prior to Admission medications   Medication Sig Start Date End Date Taking? Authorizing Provider  pantoprazole (PROTONIX) 40 MG tablet Take 1 tablet (40 mg total) by mouth 2 (two) times daily. 05/28/22  Yes Zehr, Princella Pellegrini, PA-C   ferrous sulfate 325 (65 FE) MG EC tablet Take 1 tablet (325 mg total) by mouth daily after breakfast. 10/17/21   Zehr, Shanda Bumps D, PA-C  naproxen (NAPROSYN) 500 MG tablet TAKE 1 TABLET BY MOUTH 2 TIMES DAILY WITH MEALS 05/06/20   [provider]  naproxen sodium (ALEVE) 220 MG tablet Take 440 mg by mouth as needed.    [provider]   No results found.  Positive ROS: All other systems have been reviewed and were otherwise negative with the exception of those mentioned in the HPI and as above.  Physical Exam: General: Alert, no acute distress Cardiovascular: No pedal edema Respiratory: No cyanosis, no use of accessory musculature GI: No organomegaly, abdomen is soft and non-tender Skin: No lesions in the area of chief complaint Neurologic: Sensation intact distally Psychiatric: Patient is competent for consent with normal mood and affect Lymphatic: No axillary or cervical lymphadenopathy  MUSCULOSKELETAL: Left lower extremity has medial and lateral ankle incisions consistent with previous open reduction internal fixation of a bimalleolar ankle fracture.  Neurovascular intact.  Assessment: Painful orthopedic hardware left ankle  Plan: Plan to proceed today with hardware removal left ankle.  We discussed removal of plate and screws.  We discussed risk of bleeding, infection, damage to surrounding nerves and vessels, recurrent fracture, DVT, stiffness, risk of anesthesia and she has provided informed consent.  Plan to place in soft dressing postoperatively and weight-bear as tolerated.  Discharge home postop from PACU.    Yolonda Kida, MD Cell [phone]    08/29/2022 10:33 AM

## 2022-09-01 ENCOUNTER — Encounter (HOSPITAL_BASED_OUTPATIENT_CLINIC_OR_DEPARTMENT_OTHER): Payer: Self-pay | Admitting: Orthopedic Surgery

## 2022-09-11 ENCOUNTER — Other Ambulatory Visit (HOSPITAL_COMMUNITY): Payer: Self-pay | Admitting: Physician Assistant

## 2022-09-11 ENCOUNTER — Ambulatory Visit (HOSPITAL_COMMUNITY)
Admission: RE | Admit: 2022-09-11 | Discharge: 2022-09-11 | Disposition: A | Discharge status: Home / Self Care | Payer: BC Managed Care – PPO | Source: Ambulatory Visit | Attending: Vascular Surgery | Admitting: Vascular Surgery

## 2022-09-11 DIAGNOSIS — M79605 Pain in left leg: Secondary | ICD-10-CM

## 2022-12-05 ENCOUNTER — Telehealth: Payer: Self-pay | Admitting: Gastroenterology

## 2022-12-05 DIAGNOSIS — K2100 Gastro-esophageal reflux disease with esophagitis, without bleeding: Secondary | ICD-10-CM

## 2022-12-05 DIAGNOSIS — R1310 Dysphagia, unspecified: Secondary | ICD-10-CM

## 2022-12-05 NOTE — Telephone Encounter (Signed)
Error

## 2022-12-05 NOTE — Telephone Encounter (Signed)
Patient called and requested a refill on her medication Pantroprazole and Ferrous sulfate. Please advise.

## 2022-12-08 MED ORDER — PANTOPRAZOLE SODIUM 40 MG PO TBEC
40.0000 mg | DELAYED_RELEASE_TABLET | Freq: Two times a day (BID) | ORAL | 0 refills | Status: AC
Start: 2022-12-08 — End: ?

## 2022-12-08 MED ORDER — FERROUS SULFATE 325 (65 FE) MG PO TBEC: 325.0000 mg | DELAYED_RELEASE_TABLET | Freq: Every day | ORAL | 0 refills | Status: AC

## 2022-12-08 NOTE — Telephone Encounter (Signed)
Script sent to the pharmacy.
